# Patient Record
Sex: Female | Born: 1965 | Race: White | Hispanic: No | Marital: Married | Smoking: Never smoker
Health system: Southern US, Community
[De-identification: ages and names within clinical notes are randomized; demographics above are authoritative.]

## PROBLEM LIST (undated history)

## (undated) DIAGNOSIS — E039 Hypothyroidism, unspecified: Secondary | ICD-10-CM

## (undated) DIAGNOSIS — Z8489 Family history of other specified conditions: Secondary | ICD-10-CM

## (undated) DIAGNOSIS — M1990 Unspecified osteoarthritis, unspecified site: Secondary | ICD-10-CM

## (undated) HISTORY — DX: Hypothyroidism, unspecified: E03.9

## (undated) HISTORY — PX: NO PAST SURGERIES: SHX2092

---

## 1997-10-17 ENCOUNTER — Ambulatory Visit (HOSPITAL_COMMUNITY): Admission: RE | Admit: 1997-10-17 | Discharge: 1997-10-17 | Payer: Self-pay | Admitting: Obstetrics and Gynecology

## 1997-12-23 ENCOUNTER — Inpatient Hospital Stay (HOSPITAL_COMMUNITY): Admission: AD | Admit: 1997-12-23 | Discharge: 1997-12-26 | Payer: Self-pay | Admitting: Obstetrics and Gynecology

## 1998-02-04 ENCOUNTER — Other Ambulatory Visit: Admission: RE | Admit: 1998-02-04 | Discharge: 1998-02-04 | Payer: Self-pay | Admitting: Obstetrics and Gynecology

## 1999-12-28 ENCOUNTER — Other Ambulatory Visit: Admission: RE | Admit: 1999-12-28 | Discharge: 1999-12-28 | Payer: Self-pay | Admitting: Obstetrics and Gynecology

## 2001-06-14 ENCOUNTER — Other Ambulatory Visit: Admission: RE | Admit: 2001-06-14 | Discharge: 2001-06-14 | Payer: Self-pay | Admitting: Obstetrics and Gynecology

## 2002-06-20 ENCOUNTER — Other Ambulatory Visit: Admission: RE | Admit: 2002-06-20 | Discharge: 2002-06-20 | Payer: Self-pay | Admitting: Obstetrics and Gynecology

## 2003-06-24 ENCOUNTER — Other Ambulatory Visit: Admission: RE | Admit: 2003-06-24 | Discharge: 2003-06-24 | Payer: Self-pay | Admitting: Obstetrics and Gynecology

## 2004-06-25 ENCOUNTER — Other Ambulatory Visit: Admission: RE | Admit: 2004-06-25 | Discharge: 2004-06-25 | Payer: Self-pay | Admitting: Obstetrics and Gynecology

## 2004-07-15 ENCOUNTER — Encounter: Admission: RE | Admit: 2004-07-15 | Discharge: 2004-07-15 | Payer: Self-pay | Admitting: Obstetrics and Gynecology

## 2005-06-30 ENCOUNTER — Other Ambulatory Visit: Admission: RE | Admit: 2005-06-30 | Discharge: 2005-06-30 | Payer: Self-pay | Admitting: Obstetrics and Gynecology

## 2005-07-23 ENCOUNTER — Encounter: Admission: RE | Admit: 2005-07-23 | Discharge: 2005-07-23 | Payer: Self-pay | Admitting: Obstetrics and Gynecology

## 2006-07-07 ENCOUNTER — Other Ambulatory Visit: Admission: RE | Admit: 2006-07-07 | Discharge: 2006-07-07 | Payer: Self-pay | Admitting: Obstetrics and Gynecology

## 2006-10-27 ENCOUNTER — Encounter: Admission: RE | Admit: 2006-10-27 | Discharge: 2006-10-27 | Payer: Self-pay | Admitting: Obstetrics and Gynecology

## 2007-10-04 ENCOUNTER — Other Ambulatory Visit: Admission: RE | Admit: 2007-10-04 | Discharge: 2007-10-04 | Payer: Self-pay | Admitting: Obstetrics and Gynecology

## 2007-11-01 ENCOUNTER — Encounter: Admission: RE | Admit: 2007-11-01 | Discharge: 2007-11-01 | Payer: Self-pay | Admitting: Obstetrics and Gynecology

## 2007-11-08 ENCOUNTER — Encounter: Admission: RE | Admit: 2007-11-08 | Discharge: 2007-11-08 | Payer: Self-pay | Admitting: Obstetrics and Gynecology

## 2008-10-09 ENCOUNTER — Other Ambulatory Visit: Admission: RE | Admit: 2008-10-09 | Discharge: 2008-10-09 | Payer: Self-pay | Admitting: Obstetrics and Gynecology

## 2008-10-25 ENCOUNTER — Encounter: Payer: Self-pay | Admitting: Family Medicine

## 2008-11-28 ENCOUNTER — Encounter: Admission: RE | Admit: 2008-11-28 | Discharge: 2008-11-28 | Payer: Self-pay | Admitting: Obstetrics and Gynecology

## 2008-12-12 ENCOUNTER — Ambulatory Visit: Payer: Self-pay | Admitting: Family Medicine

## 2008-12-12 DIAGNOSIS — E039 Hypothyroidism, unspecified: Secondary | ICD-10-CM | POA: Insufficient documentation

## 2009-02-11 ENCOUNTER — Ambulatory Visit: Payer: Self-pay | Admitting: Family Medicine

## 2009-02-17 ENCOUNTER — Encounter (INDEPENDENT_AMBULATORY_CARE_PROVIDER_SITE_OTHER): Payer: Self-pay | Admitting: *Deleted

## 2009-02-17 LAB — CONVERTED CEMR LAB: Free T4: 0.9 ng/dL (ref 0.6–1.6)

## 2009-10-09 ENCOUNTER — Other Ambulatory Visit: Admission: RE | Admit: 2009-10-09 | Discharge: 2009-10-09 | Payer: Self-pay | Admitting: Obstetrics and Gynecology

## 2009-12-01 ENCOUNTER — Encounter: Admission: RE | Admit: 2009-12-01 | Discharge: 2009-12-01 | Payer: Self-pay | Admitting: Obstetrics and Gynecology

## 2010-04-27 ENCOUNTER — Telehealth (INDEPENDENT_AMBULATORY_CARE_PROVIDER_SITE_OTHER): Payer: Self-pay | Admitting: *Deleted

## 2010-05-01 ENCOUNTER — Ambulatory Visit
Admission: RE | Admit: 2010-05-01 | Discharge: 2010-05-01 | Payer: Self-pay | Source: Home / Self Care | Attending: Family Medicine | Admitting: Family Medicine

## 2010-05-01 ENCOUNTER — Encounter: Payer: Self-pay | Admitting: Family Medicine

## 2010-05-01 ENCOUNTER — Ambulatory Visit: Admit: 2010-05-01 | Payer: Self-pay | Admitting: Family Medicine

## 2010-05-04 ENCOUNTER — Encounter: Payer: Self-pay | Admitting: Obstetrics and Gynecology

## 2010-05-05 ENCOUNTER — Telehealth (INDEPENDENT_AMBULATORY_CARE_PROVIDER_SITE_OTHER): Payer: Self-pay | Admitting: *Deleted

## 2010-05-05 LAB — CONVERTED CEMR LAB: Free T4: 1.07 ng/dL (ref 0.80–1.80)

## 2010-05-14 NOTE — Progress Notes (Signed)
Summary: LAB ORDERS  Phone Note Call from Patient   Caller: Patient Summary of Call: PT HAD CPX BACK IN [place] WITH GYN .JUST NEED LABS WORK DONE FOR A TSH.  JUST NEED ORDERS FOR THIS. APPT HAS BEEN MADE FOR FRI. 20. Initial call taken by: Freddy Jaksch,  April 27, 2010 1:47 PM  Follow-up for Phone Call        please advise Follow-up by: Almeta Monas CMA Duncan Dull),  April 27, 2010 2:02 PM  Additional Follow-up for Phone Call Additional follow up Details #1::        she needs appointment to see me---not just for labs Additional Follow-up by: Loreen Freud DO,  April 27, 2010 3:03 PM    Additional Follow-up for Phone Call Additional follow up Details #2::    PT WAS CALL BACK AND WAS SCHED FOR AN OV WITH DOC. Follow-up by: Freddy Jaksch,  April 27, 2010 4:05 PM

## 2010-05-14 NOTE — Assessment & Plan Note (Signed)
Summary: CHECK UP AND LABS//PH   Vital Signs:  Patient profile:   45 year old female Height:      65 inches Weight:      177.6 pounds BMI:     29.66 Pulse rate:   76 / minute Pulse rhythm:   regular BP sitting:   124 / 72  (right arm) Cuff size:   regular  Vitals Entered By: Almeta Monas CMA Duncan Dull) (May 01, 2010 2:59 PM) CC: checkup...pt needs TSH done for thyroid meds   History of Present Illness: Pt here f/u thyroid only.   No complaints.  Problems Prior to Update: 1)  Hypothyroidism  (ICD-244.9)  Medications Prior to Update: 1)  Loestrin 24 Fe 1-20 Mg-Mcg Tabs (Norethin Ace-Eth Estrad-Fe) .... As Directed 2)  Synthroid 50 Mcg Tabs (Levothyroxine Sodium) .Marland Kitchen.. 1 By Mouth Once Daily** Office Visit and Labs Due Now**  Current Medications (verified): 1)  Loestrin 24 Fe 1-20 Mg-Mcg Tabs (Norethin Ace-Eth Estrad-Fe) .... As Directed 2)  Synthroid 50 Mcg Tabs (Levothyroxine Sodium) .Marland Kitchen.. 1 By Mouth Once Daily  Allergies (verified): No Known Drug Allergies  Past History:  Family History: Last updated: 12/12/2008 DM -PATERNAL GF,FATHER HTN -MOTHER BREAST CA-AUNT Father--throat cancer, heart transplant  Social History: Last updated: 12/12/2008 Married Never Smoked Alcohol use-yes Drug use-no Regular exercise-yes  Risk Factors: Alcohol Use: <1 (12/12/2008) Caffeine Use: 1 CUP A DAY (12/12/2008) Exercise: yes (12/12/2008)  Risk Factors: Smoking Status: never (12/12/2008)  Past medical, surgical, family and social histories (including risk factors) reviewed for relevance to current acute and chronic problems.  Past Medical History: Reviewed history from 12/12/2008 and no changes required. Hypothyroidism  Past Surgical History: Reviewed history from 12/12/2008 and no changes required. none  Family History: Reviewed history from 12/12/2008 and no changes required. DM -PATERNAL GF,FATHER HTN -MOTHER BREAST CA-AUNT Father--throat cancer, heart  transplant  Social History: Reviewed history from 12/12/2008 and no changes required. Married Never Smoked Alcohol use-yes Drug use-no Regular exercise-yes  Review of Systems      See HPI  The patient denies anorexia, fever, weight loss, weight gain, vision loss, decreased hearing, hoarseness, chest pain, syncope, dyspnea on exertion, peripheral edema, prolonged cough, headaches, hemoptysis, abdominal pain, melena, hematochezia, severe indigestion/heartburn, hematuria, incontinence, genital sores, muscle weakness, suspicious skin lesions, transient blindness, difficulty walking, depression, unusual weight change, abnormal bleeding, enlarged lymph nodes, angioedema, breast masses, and testicular masses.   GI:  Denies constipation and diarrhea. Derm:  Denies changes in color of skin, changes in nail beds, dryness, excessive perspiration, and hair loss.  Physical Exam  General:  Well-developed,well-nourished,in no acute distress; alert,appropriate and cooperative throughout examination Neck:  No deformities, masses, or tenderness noted. Lungs:  Normal respiratory effort, chest expands symmetrically. Lungs are clear to auscultation, no crackles or wheezes. Heart:  normal rate and no murmur.   Skin:  Intact without suspicious lesions or rashes Psych:  Oriented X3 and normally interactive.     Impression & Recommendations:  Problem # 1:  HYPOTHYROIDISM (ICD-244.9)  Her updated medication list for this problem includes:    Synthroid 50 Mcg Tabs (Levothyroxine sodium) .Marland Kitchen... 1 by mouth once daily  Orders: Venipuncture (16109) Specimen Handling (60454)  Labs Reviewed: TSH: 4.08 (02/11/2009)     Complete Medication List: 1)  Loestrin 24 Fe 1-20 Mg-mcg Tabs (Norethin ace-eth estrad-fe) .... As directed 2)  Synthroid 50 Mcg Tabs (Levothyroxine sodium) .Marland Kitchen.. 1 by mouth once daily  Other Orders: Tdap => 47yrs IM (09811) Admin 1st Vaccine (91478)  Patient Instructions: 1)  Please  schedule a follow-up appointment in 1 year.  Prescriptions: SYNTHROID 50 MCG TABS (LEVOTHYROXINE SODIUM) 1 by mouth once daily Brand medically necessary #90 x 3   Entered and Authorized by:   Loreen Freud DO   Signed by:   Loreen Freud DO on 05/01/2010   Method used:   Print then Give to Patient   RxID:   [id number] SYNTHROID 50 MCG TABS (LEVOTHYROXINE SODIUM) 1 by mouth once daily** OFFICE VISIT AND LABS DUE NOW** Brand medically necessary #90 x 3   Entered and Authorized by:   Loreen Freud DO   Signed by:   Loreen Freud DO on 05/01/2010   Method used:   Print then Give to Patient   RxID:   [id number]    Orders Added: 1)  Tdap => 64yrs IM [90715] 2)  Admin 1st Vaccine [90471] 3)  Venipuncture [32440] 4)  Est. Patient Level III [10272] 5)  Specimen Handling [99000]   Immunizations Administered:  Tetanus Vaccine:    Vaccine Type: Tdap    Site: left deltoid    Mfr: Merck    Dose: 0.5 ml    Route: IM    Given by: Almeta Monas CMA (AAMA)    Exp. Date: 01/30/2012    Lot #: [id number]    VIS given: 02/28/08 version given May 01, 2010.   Immunizations Administered:  Tetanus Vaccine:    Vaccine Type: Tdap    Site: left deltoid    Mfr: Merck    Dose: 0.5 ml    Route: IM    Given by: Almeta Monas CMA (AAMA)    Exp. Date: 01/30/2012    Lot #: [id number]    VIS given: 02/28/08 version given May 01, 2010.

## 2010-05-14 NOTE — Progress Notes (Signed)
Summary: Results  Phone Note Outgoing Call   Call placed by: Almeta Monas CMA Duncan Dull),  May 05, 2010 10:39 AM Call placed to: Patient Details for Reason: hypothyroid---increase synthroid to 75 micrograms #30  1 by mouth once daily ----brand only----2 refills recheck 2 months     244.9  TSH Summary of Call: Left message to call back.... Almeta Monas CMA Duncan Dull)  May 05, 2010 10:39 AM   Follow-up for Phone Call        Patient is aware and needs [facility]  Follow-up by: Harold Barban,  May 05, 2010 3:23 PM    New/Updated Medications: SYNTHROID 75 MCG TABS (LEVOTHYROXINE SODIUM) 1 by mouth once daily [BMN] Prescriptions: SYNTHROID 75 MCG TABS (LEVOTHYROXINE SODIUM) 1 by mouth once daily Brand medically necessary #30 x 2   Entered by:   Almeta Monas CMA (AAMA)   Authorized by:   Loreen Freud DO   Signed by:   Almeta Monas CMA (AAMA) on 05/05/2010   Method used:   [facility]ed to ...       [facility] [phone]* (retail)       [address]       [place], [place]  [address]       Ph: [phone]       Fax: [phone]   RxID:   [id number]

## 2010-06-26 ENCOUNTER — Other Ambulatory Visit: Payer: Self-pay

## 2010-06-29 ENCOUNTER — Other Ambulatory Visit (INDEPENDENT_AMBULATORY_CARE_PROVIDER_SITE_OTHER): Payer: BC Managed Care – PPO

## 2010-06-29 ENCOUNTER — Other Ambulatory Visit (INDEPENDENT_AMBULATORY_CARE_PROVIDER_SITE_OTHER): Payer: BC Managed Care – PPO | Admitting: Family Medicine

## 2010-06-29 ENCOUNTER — Encounter (INDEPENDENT_AMBULATORY_CARE_PROVIDER_SITE_OTHER): Payer: Self-pay | Admitting: *Deleted

## 2010-06-29 DIAGNOSIS — E039 Hypothyroidism, unspecified: Secondary | ICD-10-CM

## 2010-06-30 LAB — TSH: TSH: 3.52 u[IU]/mL (ref 0.35–5.50)

## 2010-07-13 ENCOUNTER — Telehealth: Payer: Self-pay

## 2010-07-13 MED ORDER — LEVOTHYROXINE SODIUM 50 MCG PO TABS: 50.0000 ug | ORAL_TABLET | Freq: Every day | ORAL | Status: DC

## 2010-07-13 NOTE — Telephone Encounter (Signed)
Pt called Rx never rcv'd at pharmacy..Refaxed    KP

## 2010-08-27 ENCOUNTER — Telehealth: Payer: Self-pay | Admitting: Family Medicine

## 2010-08-27 NOTE — Telephone Encounter (Signed)
Patient is scheduled for lab [phone]  - is tsh only lab she needs -  need dx

## 2010-08-27 NOTE — Telephone Encounter (Signed)
Yes this is the only order due to abnormal labs in March 2012---244.9   TSH

## 2010-08-27 NOTE — Telephone Encounter (Signed)
Dx added to appt °

## 2010-08-28 ENCOUNTER — Other Ambulatory Visit: Payer: Self-pay | Admitting: *Deleted

## 2010-08-28 DIAGNOSIS — E039 Hypothyroidism, unspecified: Secondary | ICD-10-CM

## 2010-08-31 ENCOUNTER — Other Ambulatory Visit (INDEPENDENT_AMBULATORY_CARE_PROVIDER_SITE_OTHER): Payer: BC Managed Care – PPO

## 2010-08-31 DIAGNOSIS — E039 Hypothyroidism, unspecified: Secondary | ICD-10-CM

## 2010-08-31 NOTE — Progress Notes (Signed)
Addended by: Floydene Flock on: 08/31/2010 03:15 PM   Modules accepted: Orders

## 2010-09-01 LAB — TSH: TSH: 4.12 u[IU]/mL (ref 0.35–5.50)

## 2010-10-06 ENCOUNTER — Other Ambulatory Visit (HOSPITAL_COMMUNITY)
Admission: RE | Admit: 2010-10-06 | Discharge: 2010-10-06 | Disposition: A | Discharge status: Home / Self Care | Payer: BC Managed Care – PPO | Source: Ambulatory Visit | Attending: Obstetrics and Gynecology | Admitting: Obstetrics and Gynecology

## 2010-10-06 DIAGNOSIS — Z01419 Encounter for gynecological examination (general) (routine) without abnormal findings: Secondary | ICD-10-CM | POA: Insufficient documentation

## 2010-10-12 ENCOUNTER — Other Ambulatory Visit: Payer: Self-pay | Admitting: Obstetrics and Gynecology

## 2010-10-12 DIAGNOSIS — Z1231 Encounter for screening mammogram for malignant neoplasm of breast: Secondary | ICD-10-CM

## 2010-11-06 ENCOUNTER — Other Ambulatory Visit: Payer: Self-pay | Admitting: Family Medicine

## 2010-11-06 MED ORDER — LEVOTHYROXINE SODIUM 50 MCG PO TABS: 50.0000 ug | ORAL_TABLET | Freq: Every day | ORAL | Status: DC

## 2010-11-06 NOTE — Telephone Encounter (Signed)
Refill sent.

## 2010-11-06 NOTE — Telephone Encounter (Signed)
Patient wants Synthroid -sent to [facility]  90 day supply

## 2010-12-03 ENCOUNTER — Ambulatory Visit: Payer: BC Managed Care – PPO

## 2010-12-09 ENCOUNTER — Ambulatory Visit
Admission: RE | Admit: 2010-12-09 | Discharge: 2010-12-09 | Disposition: A | Discharge status: Home / Self Care | Payer: BC Managed Care – PPO | Source: Ambulatory Visit | Attending: Obstetrics and Gynecology | Admitting: Obstetrics and Gynecology

## 2010-12-09 DIAGNOSIS — Z1231 Encounter for screening mammogram for malignant neoplasm of breast: Secondary | ICD-10-CM

## 2011-06-02 ENCOUNTER — Encounter: Payer: Self-pay | Admitting: Family Medicine

## 2011-06-02 ENCOUNTER — Ambulatory Visit (INDEPENDENT_AMBULATORY_CARE_PROVIDER_SITE_OTHER): Payer: BC Managed Care – PPO | Admitting: Family Medicine

## 2011-06-02 VITALS — BP 108/68 | HR 98 | Temp 98.7°F | Wt 176.6 lb

## 2011-06-02 DIAGNOSIS — J40 Bronchitis, not specified as acute or chronic: Secondary | ICD-10-CM

## 2011-06-02 MED ORDER — GUAIFENESIN-CODEINE 100-10 MG/5ML PO SYRP: 5.0000 mL | ORAL_SOLUTION | Freq: Three times a day (TID) | ORAL | Status: AC | PRN

## 2011-06-02 MED ORDER — AZITHROMYCIN 250 MG PO TABS: ORAL_TABLET | ORAL | Status: AC

## 2011-06-02 NOTE — Progress Notes (Signed)
  Subjective:     [name] is a 46 y.o. female who presents for evaluation of symptoms of a URI. Symptoms include achiness, congestion, cough described as nonproductive, low grade fever, nasal congestion, post nasal drip, purulent nasal discharge and sinus pressure. Onset of symptoms was 2 days ago, and has been gradually worsening since that time. Treatment to date: antihistamines and cough suppressants.  The following portions of the patient's history were reviewed and updated as appropriate: allergies, current medications, past family history, past medical history, past social history, past surgical history and problem list.  Review of Systems Pertinent items are noted in HPI.   Objective:    BP 108/68  Pulse 98  Temp(Src) 98.7 F (37.1 C) (Oral)  Wt 176 lb 9.6 oz (80.105 kg)  SpO2 98% General appearance: alert, cooperative, appears stated age and no distress Ears: normal TM's and external ear canals both ears Nose: clear discharge, mild congestion, turbinates normal, no sinus tenderness Throat: abnormal findings: pnd Neck: mild anterior cervical adenopathy, supple, symmetrical, trachea midline and thyroid not enlarged, symmetric, no tenderness/mass/nodules Lungs: diminished breath sounds bilaterally Heart: regular rate and rhythm, S1, S2 normal, no murmur, click, rub or gallop   Assessment:    bronchitis   Plan:    Suggested symptomatic OTC remedies. Nasal saline spray for congestion. Follow up as needed. rto prn  z pak cheratussin for night time, robitussin for day

## 2011-06-02 NOTE — Patient Instructions (Signed)

## 2011-10-05 ENCOUNTER — Other Ambulatory Visit: Payer: Self-pay | Admitting: Family Medicine

## 2011-10-05 MED ORDER — LEVOTHYROXINE SODIUM 50 MCG PO TABS: 50.0000 ug | ORAL_TABLET | Freq: Every day | ORAL | Status: DC

## 2011-10-05 NOTE — Telephone Encounter (Signed)
refill l-thyroxine synthroid tab 50 MCG #90, take one tablet daily, last fill 7.27.12,  last OV 2.20.13

## 2011-10-07 ENCOUNTER — Other Ambulatory Visit (HOSPITAL_COMMUNITY)
Admission: RE | Admit: 2011-10-07 | Discharge: 2011-10-07 | Disposition: A | Discharge status: Home / Self Care | Payer: BC Managed Care – PPO | Source: Ambulatory Visit | Attending: Obstetrics and Gynecology | Admitting: Obstetrics and Gynecology

## 2011-10-07 ENCOUNTER — Other Ambulatory Visit: Payer: Self-pay | Admitting: Obstetrics and Gynecology

## 2011-10-07 DIAGNOSIS — Z1159 Encounter for screening for other viral diseases: Secondary | ICD-10-CM | POA: Insufficient documentation

## 2011-10-07 DIAGNOSIS — Z01419 Encounter for gynecological examination (general) (routine) without abnormal findings: Secondary | ICD-10-CM | POA: Insufficient documentation

## 2011-11-29 ENCOUNTER — Other Ambulatory Visit: Payer: Self-pay | Admitting: Obstetrics and Gynecology

## 2011-11-29 DIAGNOSIS — Z1231 Encounter for screening mammogram for malignant neoplasm of breast: Secondary | ICD-10-CM

## 2011-12-15 ENCOUNTER — Other Ambulatory Visit: Payer: Self-pay | Admitting: Family Medicine

## 2011-12-16 ENCOUNTER — Ambulatory Visit
Admission: RE | Admit: 2011-12-16 | Discharge: 2011-12-16 | Disposition: A | Discharge status: Home / Self Care | Payer: BC Managed Care – PPO | Source: Ambulatory Visit | Attending: Obstetrics and Gynecology | Admitting: Obstetrics and Gynecology

## 2011-12-16 DIAGNOSIS — Z1231 Encounter for screening mammogram for malignant neoplasm of breast: Secondary | ICD-10-CM

## 2012-01-24 ENCOUNTER — Ambulatory Visit: Payer: BC Managed Care – PPO | Admitting: Rehabilitation

## 2012-03-14 ENCOUNTER — Other Ambulatory Visit: Payer: Self-pay | Admitting: Family Medicine

## 2012-03-14 NOTE — Telephone Encounter (Signed)
Rx sent.    MW 

## 2012-05-26 ENCOUNTER — Ambulatory Visit: Payer: Self-pay | Admitting: Family Medicine

## 2012-05-26 ENCOUNTER — Other Ambulatory Visit: Payer: Self-pay | Admitting: Family Medicine

## 2012-05-31 ENCOUNTER — Telehealth: Payer: Self-pay

## 2012-05-31 NOTE — Telephone Encounter (Signed)
Call a Nurse report regarding sore throat. She was requesting an Abx for strep throat with a temp.     I discussed with patient and she was seen in [facility] on Friday 05/26/12 when she did not hear back from [place]. She is feeling better and was prescribed antibiotics.     KP//CMA

## 2012-09-07 ENCOUNTER — Telehealth: Payer: Self-pay | Admitting: Family Medicine

## 2012-09-07 DIAGNOSIS — E039 Hypothyroidism, unspecified: Secondary | ICD-10-CM

## 2012-09-07 NOTE — Telephone Encounter (Signed)
Patient is due for labs and wants to come in tomorrow. Needs orders.

## 2012-09-07 NOTE — Telephone Encounter (Signed)
Msg left for the patient, she has not been seen in over a year and will need an OV with the provider.     KP

## 2012-09-08 ENCOUNTER — Encounter: Payer: Self-pay | Admitting: Family Medicine

## 2012-09-08 ENCOUNTER — Other Ambulatory Visit (INDEPENDENT_AMBULATORY_CARE_PROVIDER_SITE_OTHER): Payer: BC Managed Care – PPO

## 2012-09-08 DIAGNOSIS — E039 Hypothyroidism, unspecified: Secondary | ICD-10-CM

## 2012-09-08 LAB — TSH: TSH: 5.11 u[IU]/mL (ref 0.35–5.50)

## 2012-09-11 ENCOUNTER — Telehealth: Payer: Self-pay | Admitting: Family Medicine

## 2012-09-11 ENCOUNTER — Encounter: Payer: Self-pay | Admitting: *Deleted

## 2012-09-11 MED ORDER — LEVOTHYROXINE SODIUM 50 MCG PO TABS
ORAL_TABLET | ORAL | Status: DC
Start: 2012-09-11 — End: 2012-09-11

## 2012-09-11 MED ORDER — LEVOTHYROXINE SODIUM 50 MCG PO TABS: ORAL_TABLET | ORAL | Status: DC

## 2012-09-11 NOTE — Telephone Encounter (Signed)
Patient states that she only has 1 pill left of levothyroxine and would like to know if we either have samples or could send [facility]. She would also like a refill sent to [facility].

## 2012-10-17 ENCOUNTER — Encounter: Payer: BC Managed Care – PPO | Admitting: Family Medicine

## 2012-10-19 ENCOUNTER — Encounter: Payer: Self-pay | Admitting: Family Medicine

## 2012-10-19 ENCOUNTER — Ambulatory Visit (INDEPENDENT_AMBULATORY_CARE_PROVIDER_SITE_OTHER): Payer: BC Managed Care – PPO | Admitting: Family Medicine

## 2012-10-19 VITALS — BP 110/74 | HR 66 | Temp 98.7°F | Ht 64.75 in | Wt 182.6 lb

## 2012-10-19 DIAGNOSIS — R319 Hematuria, unspecified: Secondary | ICD-10-CM

## 2012-10-19 DIAGNOSIS — E039 Hypothyroidism, unspecified: Secondary | ICD-10-CM

## 2012-10-19 DIAGNOSIS — Z0000 Encounter for general adult medical examination without abnormal findings: Secondary | ICD-10-CM

## 2012-10-19 LAB — BASIC METABOLIC PANEL
BUN: 16 mg/dL (ref 6–23)
Calcium: 9.3 mg/dL (ref 8.4–10.5)
Chloride: 101 mEq/L (ref 96–112)
Sodium: 133 mEq/L — ABNORMAL LOW (ref 135–145)

## 2012-10-19 LAB — HEPATIC FUNCTION PANEL
AST: 19 U/L (ref 0–37)
Albumin: 4.2 g/dL (ref 3.5–5.2)
Alkaline Phosphatase: 58 U/L (ref 39–117)
Total Bilirubin: 0.8 mg/dL (ref 0.3–1.2)
Total Protein: 8.2 g/dL (ref 6.0–8.3)

## 2012-10-19 LAB — POCT URINALYSIS DIPSTICK
Bilirubin, UA: NEGATIVE
Nitrite, UA: NEGATIVE
Protein, UA: NEGATIVE
Urobilinogen, UA: 0.2
pH, UA: 5

## 2012-10-19 LAB — CBC WITH DIFFERENTIAL/PLATELET
Basophils Relative: 0.4 % (ref 0.0–3.0)
Eosinophils Absolute: 0.1 10*3/uL (ref 0.0–0.7)
Eosinophils Relative: 0.9 % (ref 0.0–5.0)
Lymphocytes Relative: 25.6 % (ref 12.0–46.0)
MCHC: 33.7 g/dL (ref 30.0–36.0)
MCV: 93 fl (ref 78.0–100.0)
Neutro Abs: 6 10*3/uL (ref 1.4–7.7)

## 2012-10-19 LAB — LIPID PANEL
Cholesterol: 246 mg/dL — ABNORMAL HIGH (ref 0–200)
HDL: 69 mg/dL (ref >39.00)
Total CHOL/HDL Ratio: 4
Triglycerides: 190 mg/dL — ABNORMAL HIGH (ref 0.0–149.0)
VLDL: 38 mg/dL (ref 0.0–40.0)

## 2012-10-19 MED ORDER — LEVOTHYROXINE SODIUM 50 MCG PO TABS: ORAL_TABLET | ORAL | Status: DC

## 2012-10-19 NOTE — Patient Instructions (Addendum)
Preventive Care for Adults, Female A healthy lifestyle and preventive care can promote health and wellness. Preventive health guidelines for women include the following key practices.  A routine yearly physical is a good way to check with your caregiver about your health and preventive screening. It is a chance to share any concerns and updates on your health, and to receive a thorough exam.  Visit your dentist for a routine exam and preventive care every 6 months. Brush your teeth twice a day and floss once a day. Good oral hygiene prevents tooth decay and gum disease.  The frequency of eye exams is based on your age, health, family medical history, use of contact lenses, and other factors. Follow your caregiver's recommendations for frequency of eye exams.  Eat a healthy diet. Foods like vegetables, fruits, whole grains, low-fat dairy products, and lean protein foods contain the nutrients you need without too many calories. Decrease your intake of foods high in solid fats, added sugars, and salt. Eat the right amount of calories for you.Get information about a proper diet from your caregiver, if necessary.  Regular physical exercise is one of the most important things you can do for your health. Most adults should get at least 150 minutes of moderate-intensity exercise (any activity that increases your heart rate and causes you to sweat) each week. In addition, most adults need muscle-strengthening exercises on 2 or more days a week.  Maintain a healthy weight. The body mass index (BMI) is a screening tool to identify possible weight problems. It provides an estimate of body fat based on height and weight. Your caregiver can help determine your BMI, and can help you achieve or maintain a healthy weight.For adults 20 years and older:  A BMI below 18.5 is considered underweight.  A BMI of 18.5 to 24.9 is normal.  A BMI of 25 to 29.9 is considered overweight.  A BMI of 30 and above is  considered obese.  Maintain normal blood lipids and cholesterol levels by exercising and minimizing your intake of saturated fat. Eat a balanced diet with plenty of fruit and vegetables. Blood tests for lipids and cholesterol should begin at age 20 and be repeated every 5 years. If your lipid or cholesterol levels are high, you are over 50, or you are at high risk for heart disease, you may need your cholesterol levels checked more frequently.Ongoing high lipid and cholesterol levels should be treated with medicines if diet and exercise are not effective.  If you smoke, find out from your caregiver how to quit. If you do not use tobacco, do not start.  If you are pregnant, do not drink alcohol. If you are breastfeeding, be very cautious about drinking alcohol. If you are not pregnant and choose to drink alcohol, do not exceed 1 drink per day. One drink is considered to be 12 ounces (355 mL) of beer, 5 ounces (148 mL) of wine, or 1.5 ounces (44 mL) of liquor.  Avoid use of street drugs. Do not share needles with anyone. Ask for help if you need support or instructions about stopping the use of drugs.  High blood pressure causes heart disease and increases the risk of stroke. Your blood pressure should be checked at least every 1 to 2 years. Ongoing high blood pressure should be treated with medicines if weight loss and exercise are not effective.  If you are 55 to 49 years old, ask your caregiver if you should take aspirin to prevent strokes.  Diabetes   screening involves taking a blood sample to check your fasting blood sugar level. This should be done once every 3 years, after age 45, if you are within normal weight and without risk factors for diabetes. Testing should be considered at a younger age or be carried out more frequently if you are overweight and have at least 1 risk factor for diabetes.  Breast cancer screening is essential preventive care for women. You should practice "breast  self-awareness." This means understanding the normal appearance and feel of your breasts and may include breast self-examination. Any changes detected, no matter how small, should be reported to a caregiver. Women in their 20s and 30s should have a clinical breast exam (CBE) by a caregiver as part of a regular health exam every 1 to 3 years. After age 40, women should have a CBE every year. Starting at age 40, women should consider having a mammography (breast X-ray test) every year. Women who have a family history of breast cancer should talk to their caregiver about genetic screening. Women at a high risk of breast cancer should talk to their caregivers about having magnetic resonance imaging (MRI) and a mammography every year.  The Pap test is a screening test for cervical cancer. A Pap test can show cell changes on the cervix that might become cervical cancer if left untreated. A Pap test is a procedure in which cells are obtained and examined from the lower end of the uterus (cervix).  Women should have a Pap test starting at age 21.  Between ages 21 and 29, Pap tests should be repeated every 2 years.  Beginning at age 30, you should have a Pap test every 3 years as long as the past 3 Pap tests have been normal.  Some women have medical problems that increase the chance of getting cervical cancer. Talk to your caregiver about these problems. It is especially important to talk to your caregiver if a new problem develops soon after your last Pap test. In these cases, your caregiver may recommend more frequent screening and Pap tests.  The above recommendations are the same for women who have or have not gotten the vaccine for human papillomavirus (HPV).  If you had a hysterectomy for a problem that was not cancer or a condition that could lead to cancer, then you no longer need Pap tests. Even if you no longer need a Pap test, a regular exam is a good idea to make sure no other problems are  starting.  If you are between ages 65 and 70, and you have had normal Pap tests going back 10 years, you no longer need Pap tests. Even if you no longer need a Pap test, a regular exam is a good idea to make sure no other problems are starting.  If you have had past treatment for cervical cancer or a condition that could lead to cancer, you need Pap tests and screening for cancer for at least 20 years after your treatment.  If Pap tests have been discontinued, risk factors (such as a new sexual partner) need to be reassessed to determine if screening should be resumed.  The HPV test is an additional test that may be used for cervical cancer screening. The HPV test looks for the virus that can cause the cell changes on the cervix. The cells collected during the Pap test can be tested for HPV. The HPV test could be used to screen women aged 30 years and older, and should   be used in women of any age who have unclear Pap test results. After the age of 30, women should have HPV testing at the same frequency as a Pap test.  Colorectal cancer can be detected and often prevented. Most routine colorectal cancer screening begins at the age of 50 and continues through age 75. However, your caregiver may recommend screening at an earlier age if you have risk factors for colon cancer. On a yearly basis, your caregiver may provide home test kits to check for hidden blood in the stool. Use of a small camera at the end of a tube, to directly examine the colon (sigmoidoscopy or colonoscopy), can detect the earliest forms of colorectal cancer. Talk to your caregiver about this at age 50, when routine screening begins. Direct examination of the colon should be repeated every 5 to 10 years through age 75, unless early forms of pre-cancerous polyps or small growths are found.  Hepatitis C blood testing is recommended for all people born from 1945 through 1965 and any individual with known risks for hepatitis C.  Practice  safe sex. Use condoms and avoid high-risk sexual practices to reduce the spread of sexually transmitted infections (STIs). STIs include gonorrhea, chlamydia, syphilis, trichomonas, herpes, HPV, and human immunodeficiency virus (HIV). Herpes, HIV, and HPV are viral illnesses that have no cure. They can result in disability, cancer, and death. Sexually active women aged 25 and younger should be checked for chlamydia. Older women with new or multiple partners should also be tested for chlamydia. Testing for other STIs is recommended if you are sexually active and at increased risk.  Osteoporosis is a disease in which the bones lose minerals and strength with aging. This can result in serious bone fractures. The risk of osteoporosis can be identified using a bone density scan. Women ages 65 and over and women at risk for fractures or osteoporosis should discuss screening with their caregivers. Ask your caregiver whether you should take a calcium supplement or vitamin D to reduce the rate of osteoporosis.  Menopause can be associated with physical symptoms and risks. Hormone replacement therapy is available to decrease symptoms and risks. You should talk to your caregiver about whether hormone replacement therapy is right for you.  Use sunscreen with sun protection factor (SPF) of 30 or more. Apply sunscreen liberally and repeatedly throughout the day. You should seek shade when your shadow is shorter than you. Protect yourself by wearing long sleeves, pants, a wide-brimmed hat, and sunglasses year round, whenever you are outdoors.  Once a month, do a whole body skin exam, using a mirror to look at the skin on your back. Notify your caregiver of new moles, moles that have irregular borders, moles that are larger than a pencil eraser, or moles that have changed in shape or color.  Stay current with required immunizations.  Influenza. You need a dose every fall (or winter). The composition of the flu vaccine  changes each year, so being vaccinated once is not enough.  Pneumococcal polysaccharide. You need 1 to 2 doses if you smoke cigarettes or if you have certain chronic medical conditions. You need 1 dose at age 65 (or older) if you have never been vaccinated.  Tetanus, diphtheria, pertussis (Tdap, Td). Get 1 dose of Tdap vaccine if you are younger than age 65, are over 65 and have contact with an infant, are a healthcare worker, are pregnant, or simply want to be protected from whooping cough. After that, you need a Td   booster dose every 10 years. Consult your caregiver if you have not had at least 3 tetanus and diphtheria-containing shots sometime in your life or have a deep or dirty wound.  HPV. You need this vaccine if you are a woman age 26 or younger. The vaccine is given in 3 doses over 6 months.  Measles, mumps, rubella (MMR). You need at least 1 dose of MMR if you were born in 1957 or later. You may also need a second dose.  Meningococcal. If you are age 19 to 21 and a first-year college student living in a residence hall, or have one of several medical conditions, you need to get vaccinated against meningococcal disease. You may also need additional booster doses.  Zoster (shingles). If you are age 60 or older, you should get this vaccine.  Varicella (chickenpox). If you have never had chickenpox or you were vaccinated but received only 1 dose, talk to your caregiver to find out if you need this vaccine.  Hepatitis A. You need this vaccine if you have a specific risk factor for hepatitis A virus infection or you simply wish to be protected from this disease. The vaccine is usually given as 2 doses, 6 to 18 months apart.  Hepatitis B. You need this vaccine if you have a specific risk factor for hepatitis B virus infection or you simply wish to be protected from this disease. The vaccine is given in 3 doses, usually over 6 months. Preventive Services / Frequency Ages 19 to 39  Blood  pressure check.** / Every 1 to 2 years.  Lipid and cholesterol check.** / Every 5 years beginning at age 20.  Clinical breast exam.** / Every 3 years for women in their 20s and 30s.  Pap test.** / Every 2 years from ages 21 through 29. Every 3 years starting at age 30 through age 65 or 70 with a history of 3 consecutive normal Pap tests.  HPV screening.** / Every 3 years from ages 30 through ages 65 to 70 with a history of 3 consecutive normal Pap tests.  Hepatitis C blood test.** / For any individual with known risks for hepatitis C.  Skin self-exam. / Monthly.  Influenza immunization.** / Every year.  Pneumococcal polysaccharide immunization.** / 1 to 2 doses if you smoke cigarettes or if you have certain chronic medical conditions.  Tetanus, diphtheria, pertussis (Tdap, Td) immunization. / A one-time dose of Tdap vaccine. After that, you need a Td booster dose every 10 years.  HPV immunization. / 3 doses over 6 months, if you are 26 and younger.  Measles, mumps, rubella (MMR) immunization. / You need at least 1 dose of MMR if you were born in 1957 or later. You may also need a second dose.  Meningococcal immunization. / 1 dose if you are age 19 to 21 and a first-year college student living in a residence hall, or have one of several medical conditions, you need to get vaccinated against meningococcal disease. You may also need additional booster doses.  Varicella immunization.** / Consult your caregiver.  Hepatitis A immunization.** / Consult your caregiver. 2 doses, 6 to 18 months apart.  Hepatitis B immunization.** / Consult your caregiver. 3 doses usually over 6 months. Ages 40 to 64  Blood pressure check.** / Every 1 to 2 years.  Lipid and cholesterol check.** / Every 5 years beginning at age 20.  Clinical breast exam.** / Every year after age 40.  Mammogram.** / Every year beginning at age 40   and continuing for as long as you are in good health. Consult with your  caregiver.  Pap test.** / Every 3 years starting at age 30 through age 65 or 70 with a history of 3 consecutive normal Pap tests.  HPV screening.** / Every 3 years from ages 30 through ages 65 to 70 with a history of 3 consecutive normal Pap tests.  Fecal occult blood test (FOBT) of stool. / Every year beginning at age 50 and continuing until age 75. You may not need to do this test if you get a colonoscopy every 10 years.  Flexible sigmoidoscopy or colonoscopy.** / Every 5 years for a flexible sigmoidoscopy or every 10 years for a colonoscopy beginning at age 50 and continuing until age 75.  Hepatitis C blood test.** / For all people born from 1945 through 1965 and any individual with known risks for hepatitis C.  Skin self-exam. / Monthly.  Influenza immunization.** / Every year.  Pneumococcal polysaccharide immunization.** / 1 to 2 doses if you smoke cigarettes or if you have certain chronic medical conditions.  Tetanus, diphtheria, pertussis (Tdap, Td) immunization.** / A one-time dose of Tdap vaccine. After that, you need a Td booster dose every 10 years.  Measles, mumps, rubella (MMR) immunization. / You need at least 1 dose of MMR if you were born in 1957 or later. You may also need a second dose.  Varicella immunization.** / Consult your caregiver.  Meningococcal immunization.** / Consult your caregiver.  Hepatitis A immunization.** / Consult your caregiver. 2 doses, 6 to 18 months apart.  Hepatitis B immunization.** / Consult your caregiver. 3 doses, usually over 6 months. Ages 65 and over  Blood pressure check.** / Every 1 to 2 years.  Lipid and cholesterol check.** / Every 5 years beginning at age 20.  Clinical breast exam.** / Every year after age 40.  Mammogram.** / Every year beginning at age 40 and continuing for as long as you are in good health. Consult with your caregiver.  Pap test.** / Every 3 years starting at age 30 through age 65 or 70 with a 3  consecutive normal Pap tests. Testing can be stopped between 65 and 70 with 3 consecutive normal Pap tests and no abnormal Pap or HPV tests in the past 10 years.  HPV screening.** / Every 3 years from ages 30 through ages 65 or 70 with a history of 3 consecutive normal Pap tests. Testing can be stopped between 65 and 70 with 3 consecutive normal Pap tests and no abnormal Pap or HPV tests in the past 10 years.  Fecal occult blood test (FOBT) of stool. / Every year beginning at age 50 and continuing until age 75. You may not need to do this test if you get a colonoscopy every 10 years.  Flexible sigmoidoscopy or colonoscopy.** / Every 5 years for a flexible sigmoidoscopy or every 10 years for a colonoscopy beginning at age 50 and continuing until age 75.  Hepatitis C blood test.** / For all people born from 1945 through 1965 and any individual with known risks for hepatitis C.  Osteoporosis screening.** / A one-time screening for women ages 65 and over and women at risk for fractures or osteoporosis.  Skin self-exam. / Monthly.  Influenza immunization.** / Every year.  Pneumococcal polysaccharide immunization.** / 1 dose at age 65 (or older) if you have never been vaccinated.  Tetanus, diphtheria, pertussis (Tdap, Td) immunization. / A one-time dose of Tdap vaccine if you are over   65 and have contact with an infant, are a healthcare worker, or simply want to be protected from whooping cough. After that, you need a Td booster dose every 10 years.  Varicella immunization.** / Consult your caregiver.  Meningococcal immunization.** / Consult your caregiver.  Hepatitis A immunization.** / Consult your caregiver. 2 doses, 6 to 18 months apart.  Hepatitis B immunization.** / Check with your caregiver. 3 doses, usually over 6 months. ** Family history and personal history of risk and conditions may change your caregiver's recommendations. Document Released: 05/25/2001 Document Revised: 06/21/2011  Document Reviewed: 08/24/2010 [employer] Patient Information 2014 [employer].  

## 2012-10-19 NOTE — Progress Notes (Signed)
  Subjective:     [name] is a 47 y.o. female and is here for a comprehensive physical exam. The patient reports no problems.  History   Social History  . Marital Status: Married    Spouse Name: N/A    Number of Children: N/A  . Years of Education: N/A   Occupational History  . Not on file.   Social History Main Topics  . Smoking status: Never Smoker   . Smokeless tobacco: Never Used  . Alcohol Use: Yes     Comment: Moderate  . Drug Use: No  . Sexually Active: Not on file   Other Topics Concern  . Not on file   Social History Narrative  . No narrative on file   Health Maintenance  Topic Date Due  . Influenza Vaccine  12/11/2012  . Pap Smear  10/07/2014  . Tetanus/tdap  05/01/2020    The following portions of the patient's history were reviewed and updated as appropriate: allergies, current medications, past family history, past medical history, past social history, past surgical history and problem list.  Review of Systems Review of Systems  Constitutional: Negative for activity change, appetite change and fatigue.  HENT: Negative for hearing loss, congestion, tinnitus and ear discharge.  dentist q67m Eyes: Negative for visual disturbance (see optho q2y) Respiratory: Negative for cough, chest tightness and shortness of breath.   Cardiovascular: Negative for chest pain, palpitations and leg swelling.  Gastrointestinal: Negative for abdominal pain, diarrhea, constipation and abdominal distention.  Genitourinary: Negative for urgency, frequency, decreased urine volume and difficulty urinating.  Musculoskeletal: Negative for back pain, arthralgias and gait problem.  Skin: Negative for color change, pallor and rash.  Neurological: Negative for dizziness, light-headedness, numbness and headaches.  Hematological: Negative for adenopathy. Does not bruise/bleed easily.  Psychiatric/Behavioral: Negative for suicidal ideas, confusion, sleep disturbance, self-injury,  dysphoric mood, decreased concentration and agitation.   Dr Stefanie Libel-- [facility]-- derm Gyn-- [facility]    Objective:    BP 110/74  Pulse 66  Temp(Src) 98.7 F (37.1 C) (Oral)  Ht 5' 4.75" (1.645 m)  Wt 182 lb 9.6 oz (82.827 kg)  BMI 30.61 kg/m2  SpO2 98% General appearance: alert, cooperative, appears stated age and no distress Head: Normocephalic, without obvious abnormality, atraumatic Eyes: conjunctivae/corneas clear. PERRL, EOM's intact. Fundi benign. Ears: normal TM's and external ear canals both ears Nose: Nares normal. Septum midline. Mucosa normal. No drainage or sinus tenderness. Throat: lips, mucosa, and tongue normal; teeth and gums normal Neck: no adenopathy, no carotid bruit, no JVD, supple, symmetrical, trachea midline and thyroid not enlarged, symmetric, no tenderness/mass/nodules Back: symmetric, no curvature. ROM normal. No CVA tenderness. Lungs: clear to auscultation bilaterally Breasts: gyn Heart: regular rate and rhythm, S1, S2 normal, no murmur, click, rub or gallop Abdomen: soft, non-tender; bowel sounds normal; no masses,  no organomegaly Pelvic: deferred --gyn Extremities: extremities normal, atraumatic, no cyanosis or edema Pulses: 2+ and symmetric Skin: Skin color, texture, turgor normal. No rashes or lesions Lymph nodes: Cervical, supraclavicular, and axillary nodes normal. Neurologic: Alert and oriented X 3, normal strength and tone. Normal symmetric reflexes. Normal coordination and gait Psych--no anxiety, no depression      Assessment:    Healthy female exam.      Plan:    ghm utd Check labs See After Visit Summary for Counseling Recommendations

## 2012-10-21 ENCOUNTER — Encounter: Payer: Self-pay | Admitting: Family Medicine

## 2012-10-21 LAB — URINE CULTURE

## 2012-10-21 NOTE — Assessment & Plan Note (Signed)
Check labs con't meds 

## 2012-10-23 ENCOUNTER — Other Ambulatory Visit: Payer: Self-pay

## 2012-10-23 ENCOUNTER — Encounter: Payer: Self-pay | Admitting: Family Medicine

## 2012-10-23 MED ORDER — PRAVASTATIN SODIUM 20 MG PO TABS: 20.0000 mg | ORAL_TABLET | Freq: Every day | ORAL | Status: DC

## 2012-10-23 NOTE — Telephone Encounter (Signed)
Pt states that she would like to work on diet and exercise for 3 months instead of starting Pravachol.Please advise

## 2012-12-07 ENCOUNTER — Other Ambulatory Visit: Payer: Self-pay

## 2012-12-07 DIAGNOSIS — Z1231 Encounter for screening mammogram for malignant neoplasm of breast: Secondary | ICD-10-CM

## 2012-12-27 ENCOUNTER — Ambulatory Visit: Payer: BC Managed Care – PPO

## 2013-01-10 ENCOUNTER — Ambulatory Visit
Admission: RE | Admit: 2013-01-10 | Discharge: 2013-01-10 | Disposition: A | Discharge status: Home / Self Care | Payer: BC Managed Care – PPO | Source: Ambulatory Visit

## 2013-01-10 DIAGNOSIS — Z1231 Encounter for screening mammogram for malignant neoplasm of breast: Secondary | ICD-10-CM

## 2013-02-15 ENCOUNTER — Other Ambulatory Visit: Payer: Self-pay

## 2013-06-14 ENCOUNTER — Other Ambulatory Visit (HOSPITAL_COMMUNITY): Payer: Self-pay | Admitting: Orthopaedic Surgery

## 2013-06-20 ENCOUNTER — Encounter (HOSPITAL_COMMUNITY): Payer: Self-pay | Admitting: Pharmacy Technician

## 2013-06-22 NOTE — Patient Instructions (Signed)
20 Teresita Maradiaga  06/22/2013   Your procedure is scheduled on: 06/29/13  [facility] 10:30 AM.  Call this number if you have problems the morning of surgery [phone]: [phone]   Remember:   Do not eat food or drink liquids After Midnight.     Take these medicines the morning of surgery with A SIP OF WATER: levothyroxine   Do not wear jewelry, make-up or nail polish.  Do not wear lotions, powders, or perfumes. You may wear deodorant.  Do not shave 48 hours prior to surgery. Men may shave face and neck.  Do not bring valuables to the hospital.  Contacts, dentures or bridgework may not be worn into surgery.  Leave suitcase in the car. After surgery it may be brought to your room.  For patients admitted to the hospital, checkout time is 11:00 AM the day of discharge.   Please read over the following fact sheets that you were given:[facility] preparing for surgery sheet, MRSA Information, incentive spirometry fact sheet, blood fact sheet Birdie Sonsachel Mical Brun, RN  pre op nurse call if needed [phone]    FAILURE TO FOLLOW THESE INSTRUCTIONS MAY RESULT IN CANCELLATION OF YOUR SURGERY   Patient Signature: ___________________________________________

## 2013-06-25 ENCOUNTER — Encounter (HOSPITAL_COMMUNITY): Payer: Self-pay

## 2013-06-25 ENCOUNTER — Encounter (HOSPITAL_COMMUNITY)
Admission: RE | Admit: 2013-06-25 | Discharge: 2013-06-25 | Disposition: A | Discharge status: Home / Self Care | Payer: BC Managed Care – PPO | Source: Ambulatory Visit | Attending: Orthopaedic Surgery | Admitting: Orthopaedic Surgery

## 2013-06-25 DIAGNOSIS — Z01812 Encounter for preprocedural laboratory examination: Secondary | ICD-10-CM | POA: Insufficient documentation

## 2013-06-25 HISTORY — DX: Family history of other specified conditions: Z84.89

## 2013-06-25 HISTORY — DX: Unspecified osteoarthritis, unspecified site: M19.90

## 2013-06-25 LAB — BASIC METABOLIC PANEL
BUN: 18 mg/dL (ref 6–23)
CO2: 25 mEq/L (ref 19–32)
CREATININE: 0.75 mg/dL (ref 0.50–1.10)
Calcium: 9.5 mg/dL (ref 8.4–10.5)
Chloride: 99 mEq/L (ref 96–112)
GFR calc non Af Amer: >90 mL/min (ref >90)
Glucose, Bld: 96 mg/dL (ref 70–99)
Potassium: 3.7 mEq/L (ref 3.7–5.3)
Sodium: 138 mEq/L (ref 137–147)

## 2013-06-25 LAB — URINE MICROSCOPIC-ADD ON

## 2013-06-25 LAB — ABO/RH: ABO/RH(D): B NEG

## 2013-06-25 LAB — URINALYSIS, ROUTINE W REFLEX MICROSCOPIC
Bilirubin Urine: NEGATIVE
Glucose, UA: NEGATIVE mg/dL
KETONES UR: NEGATIVE mg/dL
Nitrite: NEGATIVE
PROTEIN: NEGATIVE mg/dL
Specific Gravity, Urine: 1.014 (ref 1.005–1.030)
UROBILINOGEN UA: 0.2 mg/dL (ref 0.0–1.0)
pH: 5 (ref 5.0–8.0)

## 2013-06-25 LAB — APTT: aPTT: 29 seconds (ref 24–37)

## 2013-06-25 LAB — CBC
HEMATOCRIT: 41.5 % (ref 36.0–46.0)
Hemoglobin: 13.9 g/dL (ref 12.0–15.0)
MCH: 30.5 pg (ref 26.0–34.0)
MCHC: 33.5 g/dL (ref 30.0–36.0)
MCV: 91.2 fL (ref 78.0–100.0)
Platelets: 308 10*3/uL (ref 150–400)
RBC: 4.55 MIL/uL (ref 3.87–5.11)
RDW: 13 % (ref 11.5–15.5)
WBC: 8.8 10*3/uL (ref 4.0–10.5)

## 2013-06-25 LAB — SURGICAL PCR SCREEN
MRSA, PCR: NEGATIVE
Staphylococcus aureus: POSITIVE — AB

## 2013-06-25 LAB — PROTIME-INR
INR: 1 (ref 0.00–1.49)
Prothrombin Time: 13 seconds (ref 11.6–15.2)

## 2013-06-25 LAB — HCG, SERUM, QUALITATIVE: Preg, Serum: NEGATIVE

## 2013-06-25 NOTE — Progress Notes (Signed)
Prescription for Mupirocin [place] [facility] [place] Pt made aware.

## 2013-06-29 ENCOUNTER — Inpatient Hospital Stay (HOSPITAL_COMMUNITY): Payer: BC Managed Care – PPO

## 2013-06-29 ENCOUNTER — Inpatient Hospital Stay (HOSPITAL_COMMUNITY)
Admission: RE | Admit: 2013-06-29 | Discharge: 2013-07-01 | DRG: 470 | Disposition: A | Discharge status: Home Health | Payer: BC Managed Care – PPO | Source: Ambulatory Visit | Attending: Orthopaedic Surgery | Admitting: Orthopaedic Surgery

## 2013-06-29 ENCOUNTER — Encounter (HOSPITAL_COMMUNITY): Payer: BC Managed Care – PPO | Admitting: Certified Registered"

## 2013-06-29 ENCOUNTER — Encounter (HOSPITAL_COMMUNITY): Payer: Self-pay | Admitting: *Deleted

## 2013-06-29 ENCOUNTER — Inpatient Hospital Stay (HOSPITAL_COMMUNITY): Payer: BC Managed Care – PPO | Admitting: Certified Registered"

## 2013-06-29 ENCOUNTER — Encounter (HOSPITAL_COMMUNITY)
Admission: RE | Disposition: A | Discharge status: Home Health | Payer: Self-pay | Source: Ambulatory Visit | Attending: Orthopaedic Surgery

## 2013-06-29 DIAGNOSIS — D62 Acute posthemorrhagic anemia: Secondary | ICD-10-CM | POA: Diagnosis not present

## 2013-06-29 DIAGNOSIS — M169 Osteoarthritis of hip, unspecified: Principal | ICD-10-CM | POA: Diagnosis present

## 2013-06-29 DIAGNOSIS — Z01812 Encounter for preprocedural laboratory examination: Secondary | ICD-10-CM

## 2013-06-29 DIAGNOSIS — E039 Hypothyroidism, unspecified: Secondary | ICD-10-CM | POA: Diagnosis present

## 2013-06-29 DIAGNOSIS — M1611 Unilateral primary osteoarthritis, right hip: Secondary | ICD-10-CM

## 2013-06-29 DIAGNOSIS — Z96649 Presence of unspecified artificial hip joint: Secondary | ICD-10-CM

## 2013-06-29 DIAGNOSIS — Z833 Family history of diabetes mellitus: Secondary | ICD-10-CM

## 2013-06-29 DIAGNOSIS — M1610 Unilateral primary osteoarthritis, unspecified hip: Principal | ICD-10-CM | POA: Diagnosis present

## 2013-06-29 DIAGNOSIS — Z8249 Family history of ischemic heart disease and other diseases of the circulatory system: Secondary | ICD-10-CM

## 2013-06-29 HISTORY — PX: TOTAL HIP ARTHROPLASTY: SHX124

## 2013-06-29 LAB — TYPE AND SCREEN
ABO/RH(D): B NEG
Antibody Screen: NEGATIVE

## 2013-06-29 SURGERY — ARTHROPLASTY, HIP, TOTAL, ANTERIOR APPROACH
Anesthesia: Spinal | Site: Hip | Laterality: Right

## 2013-06-29 MED ORDER — MENTHOL 3 MG MT LOZG: 1.0000 | LOZENGE | OROMUCOSAL | Status: DC | PRN

## 2013-06-29 MED ORDER — DEXAMETHASONE SODIUM PHOSPHATE 10 MG/ML IJ SOLN
INTRAMUSCULAR | Status: AC
  Filled 2013-06-29: qty 1

## 2013-06-29 MED ORDER — ASPIRIN EC 325 MG PO TBEC
325.0000 mg | DELAYED_RELEASE_TABLET | Freq: Two times a day (BID) | ORAL | Status: DC
  Administered 2013-06-29 – 2013-07-01 (×4): 325 mg via ORAL
  Filled 2013-06-29 (×6): qty 1

## 2013-06-29 MED ORDER — DOCUSATE SODIUM 100 MG PO CAPS
100.0000 mg | ORAL_CAPSULE | Freq: Two times a day (BID) | ORAL | Status: DC
  Administered 2013-06-29 – 2013-07-01 (×4): 100 mg via ORAL
  Filled 2013-06-29 (×3): qty 1

## 2013-06-29 MED ORDER — MIDAZOLAM HCL 5 MG/5ML IJ SOLN
INTRAMUSCULAR | Status: DC | PRN
  Administered 2013-06-29: 2 mg via INTRAVENOUS

## 2013-06-29 MED ORDER — MEPERIDINE HCL 50 MG/ML IJ SOLN: 6.2500 mg | INTRAMUSCULAR | Status: DC | PRN

## 2013-06-29 MED ORDER — 0.9 % SODIUM CHLORIDE (POUR BTL) OPTIME
TOPICAL | Status: DC | PRN
  Administered 2013-06-29: 1000 mL

## 2013-06-29 MED ORDER — PHENOL 1.4 % MT LIQD: 1.0000 | OROMUCOSAL | Status: DC | PRN

## 2013-06-29 MED ORDER — ALUM & MAG HYDROXIDE-SIMETH 200-200-20 MG/5ML PO SUSP: 30.0000 mL | ORAL | Status: DC | PRN

## 2013-06-29 MED ORDER — SODIUM CHLORIDE 0.9 % IR SOLN
Status: DC | PRN
  Administered 2013-06-29: 1000 mL

## 2013-06-29 MED ORDER — OXYCODONE HCL 5 MG PO TABS
5.0000 mg | ORAL_TABLET | ORAL | Status: DC | PRN
  Administered 2013-06-29 – 2013-07-01 (×10): 5 mg via ORAL
  Filled 2013-06-29 (×3): qty 1
  Filled 2013-06-29 (×2): qty 2
  Filled 2013-06-29 (×4): qty 1
  Filled 2013-06-29: qty 2

## 2013-06-29 MED ORDER — BUPIVACAINE HCL (PF) 0.5 % IJ SOLN
INTRAMUSCULAR | Status: AC
  Filled 2013-06-29: qty 30

## 2013-06-29 MED ORDER — FENTANYL CITRATE 0.05 MG/ML IJ SOLN
INTRAMUSCULAR | Status: AC
  Filled 2013-06-29: qty 2

## 2013-06-29 MED ORDER — ONDANSETRON HCL 4 MG/2ML IJ SOLN: 4.0000 mg | Freq: Four times a day (QID) | INTRAMUSCULAR | Status: DC | PRN

## 2013-06-29 MED ORDER — ONDANSETRON HCL 4 MG/2ML IJ SOLN
INTRAMUSCULAR | Status: DC | PRN
  Administered 2013-06-29: 4 mg via INTRAVENOUS

## 2013-06-29 MED ORDER — METOCLOPRAMIDE HCL 10 MG PO TABS: 5.0000 mg | ORAL_TABLET | Freq: Three times a day (TID) | ORAL | Status: DC | PRN

## 2013-06-29 MED ORDER — CEFAZOLIN SODIUM 1-5 GM-% IV SOLN
1.0000 g | Freq: Four times a day (QID) | INTRAVENOUS | Status: AC
  Administered 2013-06-29 – 2013-06-30 (×2): 1 g via INTRAVENOUS
  Filled 2013-06-29 (×2): qty 50

## 2013-06-29 MED ORDER — MUPIROCIN CALCIUM 2 % NA OINT
1.0000 "application " | TOPICAL_OINTMENT | Freq: Two times a day (BID) | NASAL | Status: DC
  Filled 2013-06-29 (×17): qty 1

## 2013-06-29 MED ORDER — ACETAMINOPHEN 325 MG PO TABS
650.0000 mg | ORAL_TABLET | Freq: Four times a day (QID) | ORAL | Status: DC | PRN
Start: 2013-06-29 — End: 2013-07-01

## 2013-06-29 MED ORDER — PHENYLEPHRINE HCL 10 MG/ML IJ SOLN
INTRAMUSCULAR | Status: DC | PRN
  Administered 2013-06-29 (×4): 40 ug via INTRAVENOUS

## 2013-06-29 MED ORDER — HYDROMORPHONE HCL PF 1 MG/ML IJ SOLN
1.0000 mg | INTRAMUSCULAR | Status: DC | PRN
  Administered 2013-06-29: 0.5 mg via INTRAVENOUS
  Filled 2013-06-29: qty 1

## 2013-06-29 MED ORDER — FENTANYL CITRATE 0.05 MG/ML IJ SOLN
INTRAMUSCULAR | Status: DC | PRN
  Administered 2013-06-29: 100 ug via INTRAVENOUS

## 2013-06-29 MED ORDER — SODIUM CHLORIDE 0.9 % IV SOLN
INTRAVENOUS | Status: DC
  Administered 2013-06-29 – 2013-06-30 (×2): via INTRAVENOUS

## 2013-06-29 MED ORDER — POLYETHYLENE GLYCOL 3350 17 G PO PACK
17.0000 g | PACK | Freq: Every day | ORAL | Status: DC | PRN
  Filled 2013-06-29: qty 1

## 2013-06-29 MED ORDER — MIDAZOLAM HCL 2 MG/2ML IJ SOLN
INTRAMUSCULAR | Status: AC
  Filled 2013-06-29: qty 2

## 2013-06-29 MED ORDER — BUPIVACAINE HCL (PF) 0.5 % IJ SOLN
INTRAMUSCULAR | Status: DC | PRN
  Administered 2013-06-29: 3 mL

## 2013-06-29 MED ORDER — PROMETHAZINE HCL 25 MG/ML IJ SOLN: 6.2500 mg | INTRAMUSCULAR | Status: DC | PRN

## 2013-06-29 MED ORDER — LACTATED RINGERS IV SOLN
INTRAVENOUS | Status: DC
  Administered 2013-06-29 (×3): via INTRAVENOUS
  Administered 2013-06-29: 1000 mL via INTRAVENOUS

## 2013-06-29 MED ORDER — DEXAMETHASONE SODIUM PHOSPHATE 10 MG/ML IJ SOLN
INTRAMUSCULAR | Status: DC | PRN
  Administered 2013-06-29: 10 mg via INTRAVENOUS

## 2013-06-29 MED ORDER — LACTATED RINGERS IV SOLN: INTRAVENOUS | Status: DC

## 2013-06-29 MED ORDER — PHENYLEPHRINE 40 MCG/ML (10ML) SYRINGE FOR IV PUSH (FOR BLOOD PRESSURE SUPPORT)
PREFILLED_SYRINGE | INTRAVENOUS | Status: AC
  Filled 2013-06-29: qty 10

## 2013-06-29 MED ORDER — MUPIROCIN CALCIUM 2 % NA OINT
1.0000 "application " | TOPICAL_OINTMENT | Freq: Two times a day (BID) | NASAL | Status: DC
  Filled 2013-06-29: qty 1

## 2013-06-29 MED ORDER — METHOCARBAMOL 100 MG/ML IJ SOLN
500.0000 mg | Freq: Four times a day (QID) | INTRAVENOUS | Status: DC | PRN
  Filled 2013-06-29: qty 5

## 2013-06-29 MED ORDER — METOCLOPRAMIDE HCL 5 MG/ML IJ SOLN: 5.0000 mg | Freq: Three times a day (TID) | INTRAMUSCULAR | Status: DC | PRN

## 2013-06-29 MED ORDER — HYDROMORPHONE HCL PF 1 MG/ML IJ SOLN: 0.2500 mg | INTRAMUSCULAR | Status: DC | PRN

## 2013-06-29 MED ORDER — PROPOFOL 10 MG/ML IV BOLUS
INTRAVENOUS | Status: AC
  Filled 2013-06-29: qty 20

## 2013-06-29 MED ORDER — ONDANSETRON HCL 4 MG/2ML IJ SOLN
INTRAMUSCULAR | Status: AC
  Filled 2013-06-29: qty 2

## 2013-06-29 MED ORDER — LEVOTHYROXINE SODIUM 50 MCG PO TABS
50.0000 ug | ORAL_TABLET | Freq: Every day | ORAL | Status: DC
  Administered 2013-06-30 – 2013-07-01 (×2): 50 ug via ORAL
  Filled 2013-06-29 (×4): qty 1

## 2013-06-29 MED ORDER — CEFAZOLIN SODIUM-DEXTROSE 2-3 GM-% IV SOLR
INTRAVENOUS | Status: AC
  Filled 2013-06-29: qty 50

## 2013-06-29 MED ORDER — TRANEXAMIC ACID 100 MG/ML IV SOLN
1000.0000 mg | INTRAVENOUS | Status: AC
  Administered 2013-06-29: 1000 mg via INTRAVENOUS
  Filled 2013-06-29 (×2): qty 10

## 2013-06-29 MED ORDER — METHOCARBAMOL 500 MG PO TABS
500.0000 mg | ORAL_TABLET | Freq: Four times a day (QID) | ORAL | Status: DC | PRN
  Administered 2013-06-29 – 2013-07-01 (×5): 500 mg via ORAL
  Filled 2013-06-29 (×5): qty 1

## 2013-06-29 MED ORDER — MUPIROCIN 2 % EX OINT
TOPICAL_OINTMENT | Freq: Two times a day (BID) | CUTANEOUS | Status: DC
  Administered 2013-06-30: 21:00:00 via NASAL
  Administered 2013-06-30: 2 via NASAL
  Filled 2013-06-29: qty 22

## 2013-06-29 MED ORDER — PROPOFOL INFUSION 10 MG/ML OPTIME
INTRAVENOUS | Status: DC | PRN
Start: 2013-06-29 — End: 2013-06-29
  Administered 2013-06-29: 100 ug/kg/min via INTRAVENOUS

## 2013-06-29 MED ORDER — DIPHENHYDRAMINE HCL 12.5 MG/5ML PO ELIX: 12.5000 mg | ORAL_SOLUTION | ORAL | Status: DC | PRN

## 2013-06-29 MED ORDER — ONDANSETRON HCL 4 MG PO TABS
4.0000 mg | ORAL_TABLET | Freq: Four times a day (QID) | ORAL | Status: DC | PRN
  Administered 2013-07-01: 4 mg via ORAL
  Filled 2013-06-29: qty 1

## 2013-06-29 MED ORDER — NORETHIN ACE-ETH ESTRAD-FE 1-20 MG-MCG PO TABS
1.0000 | ORAL_TABLET | Freq: Every day | ORAL | Status: DC
  Administered 2013-06-29: 1 via ORAL

## 2013-06-29 MED ORDER — CEFAZOLIN SODIUM-DEXTROSE 2-3 GM-% IV SOLR
2.0000 g | INTRAVENOUS | Status: AC
  Administered 2013-06-29: 2 g via INTRAVENOUS

## 2013-06-29 SURGICAL SUPPLY — 40 items
APL SKNCLS STERI-STRIP NONHPOA (GAUZE/BANDAGES/DRESSINGS) ×1
BAG SPEC THK2 15X12 ZIP CLS (MISCELLANEOUS)
BAG ZIPLOCK 12X15 (MISCELLANEOUS) IMPLANT
BENZOIN TINCTURE PRP APPL 2/3 (GAUZE/BANDAGES/DRESSINGS) ×2 IMPLANT
BLADE SAW SGTL 18X1.27X75 (BLADE) ×2 IMPLANT
BLADE SAW SGTL 18X1.27X75MM (BLADE) ×1
CAPT HIP PF COP ×2 IMPLANT
CELLS DAT CNTRL 66122 CELL SVR (MISCELLANEOUS) ×1 IMPLANT
CLOSURE WOUND 1/2 X4 (GAUZE/BANDAGES/DRESSINGS) ×1
DRAPE C-ARM 42X120 X-RAY (DRAPES) ×3 IMPLANT
DRAPE STERI IOBAN 125X83 (DRAPES) ×3 IMPLANT
DRAPE U-SHAPE 47X51 STRL (DRAPES) ×9 IMPLANT
DRSG AQUACEL AG ADV 3.5X10 (GAUZE/BANDAGES/DRESSINGS) ×3 IMPLANT
DURAPREP 26ML APPLICATOR (WOUND CARE) ×3 IMPLANT
ELECT BLADE TIP CTD 4 INCH (ELECTRODE) ×3 IMPLANT
ELECT REM PT RETURN 9FT ADLT (ELECTROSURGICAL) ×3
ELECTRODE REM PT RTRN 9FT ADLT (ELECTROSURGICAL) ×1 IMPLANT
GLOVE BIO SURGEON STRL SZ7.5 (GLOVE) ×3 IMPLANT
GLOVE BIOGEL PI IND STRL 8 (GLOVE) ×2 IMPLANT
GLOVE BIOGEL PI INDICATOR 8 (GLOVE) ×4
GLOVE ECLIPSE 8.0 STRL XLNG CF (GLOVE) ×3 IMPLANT
GOWN BRE IMP PREV XXLGXLNG (GOWN DISPOSABLE) ×2 IMPLANT
GOWN STRL REUS W/TWL XL LVL3 (GOWN DISPOSABLE) ×8 IMPLANT
HANDPIECE INTERPULSE COAX TIP (DISPOSABLE) ×3
KIT BASIN OR (CUSTOM PROCEDURE TRAY) ×3 IMPLANT
PACK TOTAL JOINT (CUSTOM PROCEDURE TRAY) ×3 IMPLANT
PADDING CAST COTTON 6X4 STRL (CAST SUPPLIES) ×3 IMPLANT
RETRACTOR WND ALEXIS 18 MED (MISCELLANEOUS) ×1 IMPLANT
RTRCTR WOUND ALEXIS 18CM MED (MISCELLANEOUS) ×3
SET HNDPC FAN SPRY TIP SCT (DISPOSABLE) ×1 IMPLANT
STRIP CLOSURE SKIN 1/2X4 (GAUZE/BANDAGES/DRESSINGS) ×1 IMPLANT
SUT ETHIBOND NAB CT1 #1 30IN (SUTURE) ×3 IMPLANT
SUT MNCRL AB 4-0 PS2 18 (SUTURE) ×3 IMPLANT
SUT VIC AB 0 CT1 36 (SUTURE) ×3 IMPLANT
SUT VIC AB 1 CT1 36 (SUTURE) ×3 IMPLANT
SUT VIC AB 2-0 CT1 27 (SUTURE) ×3
SUT VIC AB 2-0 CT1 TAPERPNT 27 (SUTURE) ×1 IMPLANT
TOWEL OR 17X26 10 PK STRL BLUE (TOWEL DISPOSABLE) ×3 IMPLANT
TOWEL OR NON WOVEN STRL DISP B (DISPOSABLE) ×3 IMPLANT
TRAY FOLEY CATH 14FRSI W/METER (CATHETERS) ×2 IMPLANT

## 2013-06-29 NOTE — Plan of Care (Signed)
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     

## 2013-06-29 NOTE — Anesthesia Procedure Notes (Signed)
Spinal  Patient location during procedure: OR End time: 06/29/2013 12:45 PM Staffing CRNA/Resident: Noralyn Pick Performed by: anesthesiologist and resident/CRNA  Preanesthetic Checklist Completed: patient identified, site marked, surgical consent, pre-op evaluation, timeout performed, IV checked, risks and benefits discussed and monitors and equipment checked Spinal Block Patient position: sitting Prep: Betadine Patient monitoring: heart rate, continuous pulse ox and blood pressure Approach: midline Location: L2-3 Injection technique: single-shot Needle Needle type: Sprotte  Needle gauge: 24 G Needle length: 9 cm Assessment Sensory level: T6 Additional Notes Expiration date of kit checked and confirmed. Patient tolerated procedure well, without complications.

## 2013-06-29 NOTE — Op Note (Signed)
NAMStacy Gardner:  [name], [name]             ACCOUNT NO.:  [id number]  MEDICAL RECORD NO.:  [id number]  LOCATION:  1603                         [facility]  PHYSICIAN:  Vanita PandaChristopher Y. Magnus IvanBlackman, M.D.DATE OF BIRTH:  17-Oct-1965  DATE OF PROCEDURE:  06/29/2013 DATE OF DISCHARGE:                              OPERATIVE REPORT   PREOPERATIVE DIAGNOSIS:  Severe end-stage arthritis and degenerative joint disease, right hip.  POSTOPERATIVE DIAGNOSIS:  Severe end-stage arthritis and degenerative joint disease, right hip.  PROCEDURE:  Right total hip arthroplasty through direct anterior approach.  IMPLANTS:  DePuy Sector Gription acetabular component size 48, size 32+ 4 neutral polyethylene liner, size 8 Corail femoral component with standard offset, size 32+ 1 ceramic hip ball.  SURGEON:  Vanita PandaChristopher Y. Magnus IvanBlackman MD  ASSISTANT:  Richardean CanalGilbert Clark, PA  ANESTHESIA:  Spinal.  ANTIBIOTICS:  A 2 g IV Ancef.  BLOOD LOSS:  250 mL.  COMPLICATIONS:  None.  INDICATIONS:  [name] is a very pleasant 48 year old female, who was gracious and sent to me from Dr. Sandria Balesobert Weiner, another orthopedic surgeon due to the fact that she had severe debilitating hip disease with osteoarthritis of her right hip and the fact that she wanted to have a direct anterior hip surgery procedure for hip replacement.  She has failed all conservative treatment measures including anti- inflammatories, rest, walking with assisted devices, and even an intra- articular injection.  None of these things were helping.  She has had a significant change in her quality of life that has worsened.  Her pain is daily and her mobility is limited.  At this point, she wished to proceed with a direct anterior hip arthroplasty on the right hip.  She understands the risks of acute blood loss anemia, nerve and vessel injury, fracture, infection, dislocation, DVT.  She understands the goals are to decrease pain, improve mobility, and overall  improved quality of life.  PROCEDURE DESCRIPTION:  After informed consent was obtained, appropriate right hip was marked, she was brought to the operating room and spinal anesthesia was obtained while she was on her stretcher.  She was then laid in a supine position.  Traction boots were placed on both of her feet.  A Foley catheter was placed as well.  She was then placed supine on the hana fracture table with the perineal post in place and both legs in inline skeletal traction but no traction applied.  Her right operative hip was then prepped and draped with DuraPrep and sterile drapes.  A time-out was called.  She was identified as the correct patient, correct right hip.  Of note, she was significantly short preop on that right hip.  We then made an incision posterior and inferior to the anterior-superior iliac spine and carried this obliquely down the leg.  We dissected down the tensor fascia lata muscle.  The tensor fascia was then divided longitudinally.  I then proceeded with a direct anterior approach to the hip.  A Cobra retractor was placed around the lateral neck and then up underneath the rectus femoris.  The Cobra retractor was placed medially.  I cauterized the lateral femoral circumflex vessels and then opened up the hip capsule in a L-type  format finding a decent joint effusion.  I placed the Cobra retractor within the hip capsule and then made my femoral neck cut just proximal to the lesser trochanter using an oscillating saw and completed this with an osteotome.  I placed a corkscrew guide in the femoral head and removed the femoral head in its entirety and found it to be significantly devoid of cartilage.  We then placed a bent Hohmann around the medial acetabular rim and a Cobra retractor laterally.  I cleaned the acetabular rim and the debris in the labrum.  I then began reaming under direct visualization in 2 mm increments from a size 42 up to a size 48 with all  reamers under direct visualization, the last reamer also under direct fluoroscopy so that we can obtain our reaming in inclination and anteversion.  Once I was pleased with this, I placed the real DePuy Sector Gription acetabular component size 48, apex hole eliminator guide and a single screw.  I then placed the real 32+ 4 neutral polyethylene liner.  Attention was then turned to the femur with the leg externally rotated to 100 degrees extended and adducted, I was able to gain access to the femoral canal using a box cutting osteotome and a rongeur to lateralize, then placing a Mayo retractor medially and a Hohmann retractor behind the greater trochanter, I released the lateral joint capsule, then I only passed the size 8 broach, which was nice, tight and stable.  So I trialed a standard neck and a 32+ 1 hip ball and brought the leg back over and up with traction and internal rotation, it was stable past 90 degrees of external rotation and 45 degrees of internal rotation.  It had minimal shuck and leg lengths were measured and equal under direct fluoroscopy.  I then dislocated the hip and removed the trial components.  I then placed the real Corail femoral component size 8 with standard offset and the real 32+ 1 ceramic hip ball.  We reduced this back in the acetabulum and was stable.  We copiously irrigated the soft tissues with normal saline solution, closed the joint capsule with interrupted #1 Ethibond suture followed by running #1 Vicryl in the tensor fascia, 0 Vicryl in the deep tissue, 2-0 Vicryl in subcutaneous tissue, 4-0 Monocryl subcuticular stitch and Steri-Strips on the skin. An Aquacel dressing was applied.  She was taken off the hana table and taken to the recovery room in stable condition.  All final counts were correct and no complications noted.  Of note, Rexene Edison, PA-C was present in the entire case and his presence was crucial for patient positioning, retracting, and  layered closure of the wound.     Vanita Panda. Magnus Ivan, M.D.     CYB/MEDQ  D:  06/29/2013  T:  06/29/2013  Job:  [id number]

## 2013-06-29 NOTE — Brief Op Note (Signed)
06/29/2013  2:09 PM  PATIENT:  [name]  48 y.o. female  PRE-OPERATIVE DIAGNOSIS:  Severe osteoarthritis right hip  POST-OPERATIVE DIAGNOSIS:  Severe osteoarthritis right hip  PROCEDURE:  Procedure(s): RIGHT TOTAL HIP ARTHROPLASTY ANTERIOR APPROACH (Right)  SURGEON:  Surgeon(s) and Role:    * Kathryne Hitchhristopher Y Osa Fogarty, MD - Primary  PHYSICIAN ASSISTANT: Rexene EdisonGil Clark, PA-C  ANESTHESIA:   spinal  EBL:  Total I/O In: 2000 [I.V.:2000] Out: 450 [Urine:200; Blood:250]  BLOOD ADMINISTERED:none  DRAINS: none   LOCAL MEDICATIONS USED:  NONE  SPECIMEN:  No Specimen  DISPOSITION OF SPECIMEN:  N/A  COUNTS:  YES  TOURNIQUET:  * No tourniquets in log *  DICTATION: .Other Dictation: Dictation Number [id number]  PLAN OF CARE: Admit to inpatient   PATIENT DISPOSITION:  PACU - hemodynamically stable.   Delay start of Pharmacological VTE agent (>24hrs) due to surgical blood loss or risk of bleeding: no

## 2013-06-29 NOTE — Anesthesia Postprocedure Evaluation (Signed)
  Anesthesia Post-op Note  Patient: [name]  Procedure(s) Performed: Procedure(s) (LRB): RIGHT TOTAL HIP ARTHROPLASTY ANTERIOR APPROACH (Right)  Patient Location: PACU  Anesthesia Type: Spinal  Level of Consciousness: awake and alert   Airway and Oxygen Therapy: Patient Spontanous Breathing  Post-op Pain: mild  Post-op Assessment: Post-op Vital signs reviewed, Patient's Cardiovascular Status Stable, Respiratory Function Stable, Patent Airway and No signs of Nausea or vomiting  Last Vitals:  Filed Vitals:   06/29/13 1430  BP: 105/57  Pulse: 75  Temp: 36.4 C  Resp: 12    Post-op Vital Signs: stable   Complications: No apparent anesthesia complications

## 2013-06-29 NOTE — Anesthesia Preprocedure Evaluation (Addendum)
Anesthesia Evaluation  Patient identified by MRN, date of birth, ID band Patient awake    Reviewed: Allergy & Precautions, H&P , NPO status , Patient's Chart, lab work & pertinent test results  History of Anesthesia Complications Negative for: history of anesthetic complications  Airway Mallampati: II TM Distance: >3 FB Neck ROM: Full    Dental no notable dental hx.    Pulmonary neg pulmonary ROS,  breath sounds clear to auscultation  Pulmonary exam normal       Cardiovascular negative cardio ROS  Rhythm:Regular Rate:Normal     Neuro/Psych negative neurological ROS  negative psych ROS   GI/Hepatic negative GI ROS, Neg liver ROS,   Endo/Other  negative endocrine ROS  Renal/GU negative Renal ROS  negative genitourinary   Musculoskeletal negative musculoskeletal ROS (+)   Abdominal   Peds negative pediatric ROS (+)  Hematology negative hematology ROS (+)   Anesthesia Other Findings Upper front right cap  Reproductive/Obstetrics negative OB ROS                           Anesthesia Physical Anesthesia Plan  ASA: II  Anesthesia Plan: Spinal   Post-op Pain Management:    Induction:   Airway Management Planned: Simple Face Mask  Additional Equipment:   Intra-op Plan:   Post-operative Plan:   Informed Consent: I have reviewed the patients History and Physical, chart, labs and discussed the procedure including the risks, benefits and alternatives for the proposed anesthesia with the patient or authorized representative who has indicated his/her understanding and acceptance.   Dental advisory given  Plan Discussed with: CRNA  Anesthesia Plan Comments:         Anesthesia Quick Evaluation

## 2013-06-29 NOTE — H&P (Signed)
TOTAL HIP ADMISSION H&P  Patient is admitted for right total hip arthroplasty.  Subjective:  Chief Complaint: right hip pain  HPI: [name], 48 y.o. female, has a history of pain and functional disability in the right hip(s) due to arthritis and patient has failed non-surgical conservative treatments for greater than 12 weeks to include NSAID's and/or analgesics, corticosteriod injections, flexibility and strengthening excercises, supervised PT with diminished ADL's post treatment, weight reduction as appropriate and activity modification.  Onset of symptoms was gradual starting 5 years ago with gradually worsening course since that time.The patient noted no past surgery on the right hip(s).  Patient currently rates pain in the right hip at 10 out of 10 with activity. Patient has night pain, worsening of pain with activity and weight bearing, trendelenberg gait, pain that interfers with activities of daily living, pain with passive range of motion and crepitus. Patient has evidence of subchondral cysts, subchondral sclerosis, periarticular osteophytes and joint space narrowing by imaging studies. This condition presents safety issues increasing the risk of falls.  There is no current active infection.  Patient Active Problem List   Diagnosis Date Noted  . Arthritis of right hip 06/29/2013  . HYPOTHYROIDISM 12/12/2008   Past Medical History  Diagnosis Date  . Hypothyroid   . Arthritis   . Family history of anesthesia complication     mother severe N/V    Past Surgical History  Procedure Laterality Date  . No past surgeries      No prescriptions prior to admission   No Known Allergies  History  Substance Use Topics  . Smoking status: Never Smoker   . Smokeless tobacco: Never Used  . Alcohol Use: Yes     Comment: Moderate    Family History  Problem Relation Age of Onset  . Diabetes Paternal Grandfather   . Diabetes Father   . Hypertension Mother   . Breast cancer     Aunt  . Throat cancer Father   . Heart disease Father     Heart transplant     Review of Systems  Musculoskeletal: Positive for joint pain.  All other systems reviewed and are negative.    Objective:  Physical Exam  Constitutional: She is oriented to person, place, and time. She appears well-developed and well-nourished.  HENT:  Head: Normocephalic and atraumatic.  Eyes: EOM are normal. Pupils are equal, round, and reactive to light.  Neck: Normal range of motion. Neck supple.  Cardiovascular: Normal rate and regular rhythm.   Respiratory: Effort normal and breath sounds normal.  GI: Soft. Bowel sounds are normal.  Musculoskeletal:       Right hip: She exhibits decreased range of motion, decreased strength, bony tenderness and crepitus.  Neurological: She is alert and oriented to person, place, and time.  Skin: Skin is warm and dry.  Psychiatric: She has a normal mood and affect.    Vital signs in last 24 hours:    Labs:   Estimated body mass index is 30.61 kg/(m^2) as calculated from the following:   Height as of 10/19/12: 5' 4.75" (1.645 m).   Weight as of 10/19/12: 82.827 kg (182 lb 9.6 oz).   Imaging Review Plain radiographs demonstrate severe degenerative joint disease of the right hip(s). The bone quality appears to be good for age and reported activity level.  Assessment/Plan:  End stage arthritis, right hip(s)  The patient history, physical examination, clinical judgement of the provider and imaging studies are consistent with end stage degenerative joint  disease of the right hip(s) and total hip arthroplasty is deemed medically necessary. The treatment options including medical management, injection therapy, arthroscopy and arthroplasty were discussed at length. The risks and benefits of total hip arthroplasty were presented and reviewed. The risks due to aseptic loosening, infection, stiffness, dislocation/subluxation,  thromboembolic complications and other  imponderables were discussed.  The patient acknowledged the explanation, agreed to proceed with the plan and consent was signed. Patient is being admitted for inpatient treatment for surgery, pain control, PT, OT, prophylactic antibiotics, VTE prophylaxis, progressive ambulation and ADL's and discharge planning.The patient is planning to be discharged home with home health services

## 2013-06-29 NOTE — Transfer of Care (Signed)
Immediate Anesthesia Transfer of Care Note  Patient: [name]  Procedure(s) Performed: Procedure(s): RIGHT TOTAL HIP ARTHROPLASTY ANTERIOR APPROACH (Right)  Patient Location: PACU  Anesthesia Type:Regional  Level of Consciousness: awake, alert  and oriented  Airway & Oxygen Therapy: Patient Spontanous Breathing and Patient connected to face mask oxygen  Post-op Assessment: Report given to PACU RN and Post -op Vital signs reviewed and stable  Post vital signs: Reviewed and stable  Complications: No apparent anesthesia complications

## 2013-06-30 LAB — BASIC METABOLIC PANEL
BUN: 8 mg/dL (ref 6–23)
CHLORIDE: 103 meq/L (ref 96–112)
CO2: 25 mEq/L (ref 19–32)
CREATININE: 0.65 mg/dL (ref 0.50–1.10)
Calcium: 8.7 mg/dL (ref 8.4–10.5)
GFR calc Af Amer: >90 mL/min (ref >90)
GFR calc non Af Amer: >90 mL/min (ref >90)
Glucose, Bld: 139 mg/dL — ABNORMAL HIGH (ref 70–99)
Potassium: 3.9 mEq/L (ref 3.7–5.3)
Sodium: 139 mEq/L (ref 137–147)

## 2013-06-30 LAB — CBC
HEMATOCRIT: 34.6 % — AB (ref 36.0–46.0)
Hemoglobin: 11.3 g/dL — ABNORMAL LOW (ref 12.0–15.0)
MCH: 30 pg (ref 26.0–34.0)
MCHC: 32.7 g/dL (ref 30.0–36.0)
MCV: 91.8 fL (ref 78.0–100.0)
Platelets: 267 10*3/uL (ref 150–400)
RBC: 3.77 MIL/uL — ABNORMAL LOW (ref 3.87–5.11)
RDW: 12.9 % (ref 11.5–15.5)
WBC: 7.4 10*3/uL (ref 4.0–10.5)

## 2013-06-30 MED ORDER — OXYCODONE-ACETAMINOPHEN 5-325 MG PO TABS: 1.0000 | ORAL_TABLET | ORAL | Status: DC | PRN

## 2013-06-30 MED ORDER — METHOCARBAMOL 500 MG PO TABS: 500.0000 mg | ORAL_TABLET | Freq: Four times a day (QID) | ORAL | Status: DC | PRN

## 2013-06-30 MED ORDER — ASPIRIN 325 MG PO TBEC: 325.0000 mg | DELAYED_RELEASE_TABLET | Freq: Two times a day (BID) | ORAL | Status: DC

## 2013-06-30 NOTE — Progress Notes (Signed)
   Subjective:  Patient reports pain as mild.    Objective:   VITALS:   Filed Vitals:   06/29/13 2037 06/30/13 0136 06/30/13 0500 06/30/13 0800  BP: 121/76 106/72 114/74   Pulse: 84 76 64   Temp: 98.4 F (36.9 C) 98.5 F (36.9 C) 98.1 F (36.7 C)   TempSrc: Oral Oral Oral   Resp: 16 16 16 18   Height:      Weight:      SpO2: 98% 98% 100% 100%    Neurologically intact Neurovascular intact Sensation intact distally Intact pulses distally Dorsiflexion/Plantar flexion intact Incision: dressing C/D/I and no drainage No cellulitis present Compartment soft + femoral nerve   Lab Results  Component Value Date   WBC 7.4 06/30/2013   HGB 11.3* 06/30/2013   HCT 34.6* 06/30/2013   MCV 91.8 06/30/2013   PLT 267 06/30/2013     Assessment/Plan: 1 Day Post-Op   Problem List Items Addressed This Visit   None      Expected postop acute blood loss anemia - will monitor for symptoms Up with PT/OT DVT ppx - SCDs, ambulation, asa  WBAT right and lower extremity Pain control Discharge planning   Cheral AlmasXu, Zoltan Genest Michael 06/30/2013, 10:03 AM [phone]

## 2013-06-30 NOTE — Progress Notes (Signed)
I have reviewed this note and agree with all findings. Kati Zaylynn Rickett, PT, DPT Pager: [phone]   

## 2013-06-30 NOTE — Evaluation (Addendum)
Physical Therapy Evaluation Patient Details Name: Sparkles Mcneely MRN: [id number] DOB: 1966/01/16 Today's Date: 06/30/2013 Time: 7829-5621 PT Time Calculation (min): 23 min  PT Assessment / Plan / Recommendation History of Present Illness  Pt is post-op day 1 for a right total hip arthroplasty using an anterior approach for the treatment of severe end-stage degenerative arthritis  Clinical Impression  Patient is s/p R THA surgery resulting in functional limitations due to the deficits listed below (see PT Problem List). Pt reported weakness in R hip resulting from disuse prior to surgery.  Pt ambulated in hallway and performed exercises in sitting.  Pt mobilizing well POD 1.  Patient will benefit from skilled PT to increase their independence and safety with mobility to allow discharge.    PT Assessment  Patient needs continued PT services    Follow Up Recommendations  Home health PT;Supervision - Intermittent    Does the patient have the potential to tolerate intense rehabilitation      Barriers to Discharge        Equipment Recommendations  None recommended by PT    Recommendations for Other Services     Frequency 7X/week    Precautions / Restrictions Precautions Precautions: None Restrictions Weight Bearing Restrictions: Yes RLE Weight Bearing: Weight bearing as tolerated   Pertinent Vitals/Pain Pt premedicated with pain localized to incision site.  Activity to tolerance.  Ice provided at end of treatment.      Mobility  Bed Mobility Overal bed mobility: Needs Assistance Bed Mobility: Supine to Sit Supine to sit: Min assist General bed mobility comments: verbal cues for sequnce and safety; pt reported RLE feeling 'like dead weight' and required assistance to move through transfer Transfers Overall transfer level: Needs assistance Equipment used: Rolling walker (2 wheeled) Transfers: Sit to/from Stand Sit to Stand: Min guard General transfer comment: verbal cues  for safety and sequence Ambulation/Gait Ambulation/Gait assistance: Min guard Ambulation Distance (Feet): 160 Feet Assistive device: Rolling walker (2 wheeled) Gait Pattern/deviations: Step-through pattern;Antalgic;Decreased step length - right Gait velocity: decr General Gait Details: verbal cues for safety, sequence, and use of DME, pt able to initiate more RLE hip flexion with continued ambulation, gradually increasing R step length    Exercises Total Joint Exercises Ankle Circles/Pumps: AROM;20 reps;Both Towel Squeeze: AROM;Both;15 reps Short Arc Quad: AROM;15 reps;Right Heel Slides: AROM;Right;15 reps Hip ABduction/ADduction: AROM;Right;15 reps Straight Leg Raises: AAROM;10 reps;Right Marching in Standing: AROM;Both;15 reps;Seated   PT Diagnosis: Abnormality of gait;Acute pain  PT Problem List: Decreased strength;Decreased mobility;Decreased knowledge of use of DME;Pain PT Treatment Interventions: DME instruction;Gait training;Stair training;Functional mobility training;Therapeutic activities;Therapeutic exercise;Patient/family education     PT Goals(Current goals can be found in the care plan section) Acute Rehab PT Goals Patient Stated Goal: Return home and increase function  PT Goal Formulation: With patient Time For Goal Achievement: 07/05/13 Potential to Achieve Goals: Good  Visit Information  Last PT Received On: 06/30/13 Assistance Needed: +1 History of Present Illness: Pt is post-op day 1 for a right total hip arthroplasty using an anterior approach for the treatment of severe end-stage degenerative arthritis       Prior Functioning  Home Living Family/patient expects to be discharged to:: Private residence Living Arrangements: Spouse/significant other Available Help at Discharge: Family (Pt reported mother lives close by and husband at home, pt will not be alone for more than an hour at a time throughout the day) Type of Home: House Home Access: Stairs to  enter [employer] of Steps: 1 Home Layout:  One level Home Equipment: Walker - 2 wheels Prior Function Level of Independence: Independent Comments:  (Pt and family report RLE was too weak and painful to move actively prior to surgery ) Communication Communication: No difficulties Dominant Hand: Right    Cognition  Cognition Arousal/Alertness: Awake/alert Behavior During Therapy: WFL for tasks assessed/performed Overall Cognitive Status: Within Functional Limits for tasks assessed    Extremity/Trunk Assessment Upper Extremity Assessment Upper Extremity Assessment: Overall WFL for tasks assessed Lower Extremity Assessment Lower Extremity Assessment: RLE deficits/detail (difficulty initiating hip flexion at EOB; retested after ambulation, unable to flex hip through full ROM against gravity) Cervical / Trunk Assessment Cervical / Trunk Assessment: Normal   Balance    End of Session PT - End of Session Equipment Utilized During Treatment: Gait belt Activity Tolerance: Patient tolerated treatment well Patient left: in chair;with call bell/phone within reach;with family/visitor present  GP     Bufford LopeBiesack, Deven Furia 06/30/2013, 12:22 PM Bufford LopeLaura Raeanne Deschler SPT 06/29/2013

## 2013-06-30 NOTE — Evaluation (Signed)
I have reviewed this note and agree with all findings. Kati Nihaal Friesen, PT, DPT Pager: [phone]   

## 2013-06-30 NOTE — Progress Notes (Signed)
Occupational Therapy Evaluation Patient Details Name: [name] MRN: [id number] DOB: 02/29/1966 Today's Date: 06/30/2013 Time: [id number] OT Time Calculation (min): 18 min  OT Assessment / Plan / Recommendation History of present illness Pt is post-op day 1 for a right total hip arthroplasty using an anterior approach for the treatment of evere end-stage degenerative arthritis   Clinical Impression   Patient presents to OT with decreased ADL independence, will benefit from OT to maximize independence prior to d/c home.    OT Assessment  Patient needs continued OT Services    Follow Up Recommendations   Home with spouse, PRN Assistance/Supervision   Barriers to Discharge      Equipment Recommendations  None recommended by OT    Recommendations for Other Services    Frequency  Min 2X/week    Precautions / Restrictions Precautions Precautions: None Restrictions Weight Bearing Restrictions: Yes RLE Weight Bearing: Weight bearing as tolerated   Pertinent Vitals/Pain No c/o pain, just "tightness" at hip    ADL  Eating/Feeding: Performed;Independent Where Assessed - Eating/Feeding: Chair Grooming: Performed;Wash/dry hands;Supervision/safety Where Assessed - Grooming: Unsupported standing Upper Body Bathing: Simulated;Independent Where Assessed - Upper Body Bathing: Unsupported sitting Lower Body Bathing: Simulated;Minimal assistance Where Assessed - Lower Body Bathing: Unsupported sitting Upper Body Dressing: Simulated;Independent Where Assessed - Upper Body Dressing: Unsupported sitting Lower Body Dressing: Simulated;Minimal assistance Where Assessed - Lower Body Dressing: Unsupported sit to stand Toilet Transfer: Performed;Supervision/safety Toilet Transfer Method: Sit to stand;Stand pivot AcupuncturistToilet Transfer Equipment: Comfort height toilet Toileting - Clothing Manipulation and Hygiene: Performed;Supervision/safety Where Assessed - Engineer, miningToileting Clothing Manipulation and  Hygiene: Sit to stand from 3-in-1 or toilet Transfers/Ambulation Related to ADLs: Patient sit<>stand from recliner S. Amb to/from BR with RW and S.  ADL Comments: Patient educated on LB bathing and dressing techniques including no precautions, use pain as guide, dress operated leg first. Patient has slip on shoes with rubber sole.    OT Diagnosis: Acute pain  OT Problem List: Pain;Decreased knowledge of use of DME or AE OT Treatment Interventions: Self-care/ADL training;DME and/or AE instruction;Patient/family education;Therapeutic activities   OT Goals(Current goals can be found in the care plan section) Acute Rehab OT Goals Patient Stated Goal: to go home OT Goal Formulation: With patient Time For Goal Achievement: 07/07/13 Potential to Achieve Goals: Good  Visit Information  Last OT Received On: 06/30/13 Assistance Needed: +1 History of Present Illness: Pt is post-op day 1 for a right total hip arthroplasty using an anterior approach for the treatment of evere end-stage degenerative arthritis       Prior Functioning     Home Living Family/patient expects to be discharged to:: Private residence Living Arrangements: Spouse/significant other Available Help at Discharge: Family Type of Home: House Home Access: Stairs to enter Secretary/administratorntrance Stairs-Number of Steps: 1 Home Layout: One level Home Equipment: Other (comment) (can borrow toilet rails) Prior Function Level of Independence: Independent Communication Communication: No difficulties Dominant Hand: Right         Vision/Perception     Cognition  Cognition Arousal/Alertness: Awake/alert Behavior During Therapy: WFL for tasks assessed/performed Overall Cognitive Status: Within Functional Limits for tasks assessed    Extremity/Trunk Assessment Upper Extremity Assessment Upper Extremity Assessment: Overall WFL for tasks assessed Lower Extremity Assessment Lower Extremity Assessment: Defer to PT evaluation     End  of Session OT - End of Session Equipment Utilized During Treatment: Rolling walker Activity Tolerance: Patient tolerated treatment well Patient left: in chair;with call bell/phone within reach;with family/visitor present Nurse Communication:  Other (comment) (patient urinated in toilet)  GO     [name] A 06/30/2013, 10:05 AM

## 2013-06-30 NOTE — Progress Notes (Signed)
Physical Therapy Treatment   06/30/13 1546  PT Visit Information  Last PT Received On 06/30/13  Assistance Needed +1  History of Present Illness Pt is post-op day 1 for a right total hip arthroplasty using an anterior approach for the treatment of severe end-stage degenerative arthritis  PT Time Calculation  PT Start Time 0120  PT Stop Time 0141  PT Time Calculation (min) 21 min  Subjective Data  Patient Stated Goal Return home and increase function   Precautions  Precautions None  Restrictions  Weight Bearing Restrictions Yes  RLE Weight Bearing WBAT  Cognition  Arousal/Alertness Awake/alert  Behavior During Therapy WFL for tasks assessed/performed  Overall Cognitive Status Within Functional Limits for tasks assessed  Bed Mobility  Overal bed mobility Needs Assistance  Bed Mobility Supine to Sit;Sit to Supine  Supine to sit Supervision  Sit to supine Supervision  General bed mobility comments verbal cues for sequence and safety  Transfers  Overall transfer level Needs assistance  Equipment used Rolling walker (2 wheeled)  Transfers Sit to/from Stand  Sit to Stand Min guard  General transfer comment verbal cues for safety and sequence  Ambulation/Gait  Ambulation/Gait assistance Min guard  Ambulation Distance (Feet) 240 Feet  Assistive device Rolling walker (2 wheeled)  Gait Pattern/deviations Step-through pattern;Antalgic;Decreased step length - left  Gait velocity decr  General Gait Details verbal cues for safety, sequnce, and use of DME  Total Joint Exercises  Ankle Circles/Pumps AROM;20 reps;Both  Towel Squeeze AROM;Both;15 reps  Short Arc Quad AROM;15 reps;Right  Heel Slides AROM;Right;15 reps  Hip ABduction/ADduction AROM;Right;15 reps  Straight Leg Raises AAROM;10 reps;Right  Gluteal Sets AROM;15 reps;Both  Quad Sets AROM;15 reps;Both  PT - End of Session  Equipment Utilized During Treatment Gait belt  Activity Tolerance Patient tolerated treatment well   Patient left in bed;with call bell/phone within reach;with family/visitor present  PT - Assessment/Plan  PT Plan Current plan remains appropriate  PT Frequency 7X/week  Follow Up Recommendations Home health PT;Supervision - Intermittent  PT equipment None recommended by PT  PT Goal Progression  Progress towards PT goals Progressing toward goals  Acute Rehab PT Goals  PT Goal Formulation With patient  Time For Goal Achievement 07/05/13  Potential to Achieve Goals Good   Bufford LopeLaura Destaney Sarkis SPT 06/30/2013

## 2013-06-30 NOTE — Progress Notes (Signed)
   CARE MANAGEMENT NOTE 06/30/2013  Patient:  Renette ButtersMARRARA,Syeda   Account Number:  [id number]  Date Initiated:  06/30/2013  Documentation initiated by:  [facility],Aitanna Haubner  Subjective/Objective Assessment:   RIGHT TOTAL HIP ARTHROPLASTY ANTERIOR APPROACH     Action/Plan:   HH   Anticipated DC Date:  07/01/2013   Anticipated DC Plan:  HOME W HOME HEALTH SERVICES      DC Planning Services  CM consult      [facility]   Choice offered to / List presented to:  C-1 Patient        HH arranged  HH-2 PT      [facility]   Status of service:  Completed, signed off Medicare Important Message given?   (If response is "NO", the following Medicare IM given date fields will be blank) Date Medicare IM given:   Date Additional Medicare IM given:    Discharge Disposition:  HOME W HOME HEALTH SERVICES  Per UR Regulation:    If discussed at Long Length of Stay Meetings, dates discussed:    Comments:  06/30/2013 1330 NCM spoke to pt and [facility] States she has RW and cane at home from previous surgery. Isidoro DonningAlesia Lakesha Levinson RN CCM Case Mgmt phone [phone]

## 2013-07-01 LAB — CBC
HEMATOCRIT: 32.7 % — AB (ref 36.0–46.0)
HEMOGLOBIN: 10.9 g/dL — AB (ref 12.0–15.0)
MCH: 30.5 pg (ref 26.0–34.0)
MCHC: 33.3 g/dL (ref 30.0–36.0)
MCV: 91.6 fL (ref 78.0–100.0)
Platelets: 241 10*3/uL (ref 150–400)
RBC: 3.57 MIL/uL — ABNORMAL LOW (ref 3.87–5.11)
RDW: 13.3 % (ref 11.5–15.5)
WBC: 11.3 10*3/uL — ABNORMAL HIGH (ref 4.0–10.5)

## 2013-07-01 NOTE — Progress Notes (Signed)
Discharged from floor via w/c, spouse with pt. No changes in assessment. Tait Balistreri   

## 2013-07-01 NOTE — Discharge Summary (Signed)
Patient ID: [name] MRN: [id number] DOB/AGE: [birth year] 48 y.o.  Admit date: 06/29/2013 Discharge date: 07/01/2013  Admission Diagnoses:  Principal Problem:   Arthritis of right hip Active Problems:   Status post THR (total hip replacement)   Discharge Diagnoses:  Same  Past Medical History  Diagnosis Date  . Hypothyroid   . Arthritis   . Family history of anesthesia complication     mother severe N/V    Surgeries: Procedure(s): RIGHT TOTAL HIP ARTHROPLASTY ANTERIOR APPROACH on 06/29/2013   Consultants:    Discharged Condition: Improved  Hospital Course: [name] is an 48 y.o. female who was admitted 06/29/2013 for operative treatment ofArthritis of right hip. Patient has severe unremitting pain that affects sleep, daily activities, and work/hobbies. After pre-op clearance the patient was taken to the operating room on 06/29/2013 and underwent  Procedure(s): RIGHT TOTAL HIP ARTHROPLASTY ANTERIOR APPROACH.    Patient was given perioperative antibiotics: Anti-infectives   Start     Dose/Rate Route Frequency Ordered Stop   06/29/13 2000  ceFAZolin (ANCEF) IVPB 1 g/50 mL premix     1 g 100 mL/hr over 30 Minutes Intravenous Every 6 hours 06/29/13 1610 06/30/13 0301   06/29/13 1150  ceFAZolin (ANCEF) IVPB 2 g/50 mL premix     2 g 100 mL/hr over 30 Minutes Intravenous On call to O.R. 06/29/13 1150 06/29/13 1247       Patient was given sequential compression devices, early ambulation, and chemoprophylaxis to prevent DVT.  Patient benefited maximally from hospital stay and there were no complications.    Recent vital signs: Patient Vitals for the past 24 hrs:  BP Temp Temp src Pulse Resp SpO2  07/01/13 0800 - - - - 16 95 %  07/01/13 0453 103/67 mmHg 98.4 F (36.9 C) Oral 89 16 95 %  07/01/13 0400 - - - - 16 98 %  07/01/13 0000 - - - - 16 98 %  06/30/13 2120 108/72 mmHg 98.4 F (36.9 C) Oral 84 16 98 %  06/30/13 2000 - - - - 16 99 %     Recent laboratory  studies:  Recent Labs  06/30/13 0444 07/01/13 0520  WBC 7.4 11.3*  HGB 11.3* 10.9*  HCT 34.6* 32.7*  PLT 267 241  NA 139  --   K 3.9  --   CL 103  --   CO2 25  --   BUN 8  --   CREATININE 0.65  --   GLUCOSE 139*  --   CALCIUM 8.7  --      Discharge Medications:     Medication List    STOP taking these medications       ibuprofen 200 MG tablet  Commonly known as:  ADVIL,MOTRIN      TAKE these medications       acetaminophen 325 MG tablet  Commonly known as:  TYLENOL  Take 650 mg by mouth every 6 (six) hours as needed.     aspirin 325 MG EC tablet  Take 1 tablet (325 mg total) by mouth 2 (two) times daily after a meal.     levothyroxine 50 MCG tablet  Commonly known as:  SYNTHROID, LEVOTHROID  Take 50 mcg by mouth daily before breakfast.     LOESTRIN FE 1/20 1-20 MG-MCG tablet  Generic drug:  norethindrone-ethinyl estradiol  Take 1 tablet by mouth daily.     methocarbamol 500 MG tablet  Commonly known as:  ROBAXIN  Take 1 tablet (500 mg total)  by mouth every 6 (six) hours as needed for muscle spasms.     mupirocin nasal ointment 2 %  Commonly known as:  BACTROBAN  Place 1 application into the nose 2 (two) times daily. Use one-half of tube in each nostril twice daily for five (5) days. After application, press sides of nose together and gently massage.     oxyCODONE-acetaminophen 5-325 MG per tablet  Commonly known as:  ROXICET  Take 1-2 tablets by mouth every 4 (four) hours as needed for severe pain.        Diagnostic Studies: Dg Hip Complete Right  06/29/2013   CLINICAL DATA:  Right hip replacement.  EXAM: RIGHT HIP - COMPLETE 2+ VIEW; DG C-ARM 1-60 MIN - NRPT [facility]  COMPARISON:  MR HIP*R* WO/W CM dated 01/11/2012  FINDINGS: 2 intraoperative spot images demonstrate changes of right hip replacement. Normal AP alignment. No visible hardware or bony complicating feature.  IMPRESSION: Right hip replacement.  No complicating feature.   Electronically Signed    By: Charlett Nose M.D.   On: 06/29/2013 14:19   Dg Pelvis Portable  06/29/2013   CLINICAL DATA:  Postop right hip replacement.  EXAM: PORTABLE PELVIS 1-2 VIEWS  COMPARISON:  06/29/2013 intraoperative spot fluoroscopic images  FINDINGS: Single portable AP radiograph of the pelvis demonstrates sequelae of right total hip arthroplasty. The prosthetic femoral and acetabular components are approximated. There is no radiographic evidence of hardware complication. Overlying postoperative soft tissue emphysema is present. The bony pelvis appears intact.  IMPRESSION: Unremarkable appearance of right total hip arthroplasty.   Electronically Signed   By: Sebastian Ache   On: 06/29/2013 16:03   Dg Hip Portable 1 View Right  06/29/2013   CLINICAL DATA:  Postop hip replacement.  EXAM: PORTABLE RIGHT HIP - 1 VIEW  COMPARISON:  06/29/2013 intraoperative fluoroscopic images.  FINDINGS: Single lateral radiograph demonstrates a right total hip arthroplasty. The prosthetic femoral and acetabular components are approximated. No radiographic evidence of hardware complication. There is mild overlying soft tissue emphysema, postoperative in nature.  IMPRESSION: Unremarkable appearance of right total hip arthroplasty.   Electronically Signed   By: Sebastian Ache   On: 06/29/2013 16:02   Dg C-arm 1-60 Min-no Report  06/29/2013   CLINICAL DATA:  Right hip replacement.  EXAM: RIGHT HIP - COMPLETE 2+ VIEW; DG C-ARM 1-60 MIN - [facility]PT [facility]  COMPARISON:  MR HIP*R* WO/W CM dated 01/11/2012  FINDINGS: 2 intraoperative spot images demonstrate changes of right hip replacement. Normal AP alignment. No visible hardware or bony complicating feature.  IMPRESSION: Right hip replacement.  No complicating feature.   Electronically Signed   By: Charlett Nose M.D.   On: 06/29/2013 14:19    Disposition: 01-Home or Self Care      Discharge Orders   Future Orders Complete By Expires   Call MD / Call 911  As directed    Comments:     If you experience  chest pain or shortness of breath, CALL 911 and be transported to the hospital emergency room.  If you develope a fever above 101 F, pus (white drainage) or increased drainage or redness at the wound, or calf pain, call your surgeon's office.   Call MD / Call 911  As directed    Comments:     If you experience chest pain or shortness of breath, CALL 911 and be transported to the hospital emergency room.  If you develope a fever above 101.5 F, pus (white drainage) or  increased drainage or redness at the wound, or calf pain, call your surgeon's office.   Constipation Prevention  As directed    Comments:     Drink plenty of fluids.  Prune juice may be helpful.  You may use a stool softener, such as Colace (over the counter) 100 mg twice a day.  Use MiraLax (over the counter) for constipation as needed.   Constipation Prevention  As directed    Comments:     Drink plenty of fluids.  Prune juice may be helpful.  You may use a stool softener, such as Colace (over the counter) 100 mg twice a day.  Use MiraLax (over the counter) for constipation as needed.   Diet - low sodium heart healthy  As directed    Discharge instructions  As directed    Comments:     Increase your activities as comfort allows. Ice as needed for swelling. Pump your feet occasionally during the day. You can get your dressing wet in the shower. You can starting getting your actual incision wet itself starting Friday; then new dry dressing daily. Get an over-the-counter stool softener.   Driving restrictions  As directed    Comments:     No driving while taking narcotic pain meds.   Follow the hip precautions as taught in Physical Therapy  As directed    Increase activity slowly as tolerated  As directed    Increase activity slowly as tolerated  As directed    TED hose  As directed    Comments:     Use stockings (TED hose) for 6 weeks on both leg(s).  You may remove them at night for sleeping.      Follow-up Information    [facility] Health Physical Therapy)    Contact information:   [address] [place] [address] [phone]       Follow up with Kathryne HitchBLACKMAN,CHRISTOPHER Y, MD In 2 weeks.   Specialty:  Orthopedic Surgery   Contact information:   [address] [place] [address] [phone]        Signed: Kathryne HitchBLACKMAN,CHRISTOPHER Y 07/01/2013, 5:19 PM

## 2013-07-01 NOTE — Progress Notes (Signed)
Subjective:  Patient reports pain as mild.    Objective:   VITALS:   Filed Vitals:   07/01/13 0000 07/01/13 0400 07/01/13 0453 07/01/13 0800  BP:   103/67   Pulse:   89   Temp:   98.4 F (36.9 C)   TempSrc:   Oral   Resp: 16 16 16 16   Height:      Weight:      SpO2: 98% 98% 95% 95%    Exam stable Dressing c/d/i   Lab Results  Component Value Date   WBC 11.3* 07/01/2013   HGB 10.9* 07/01/2013   HCT 32.7* 07/01/2013   MCV 91.6 07/01/2013   PLT 241 07/01/2013     Assessment/Plan: 2 Days Post-Op   Problem List Items Addressed This Visit     Musculoskeletal and Integument   *Arthritis of right hip - Primary   Relevant Medications      acetaminophen (TYLENOL) 325 MG tablet      acetaminophen (TYLENOL) tablet 650 mg      aspirin EC tablet 325 mg      oxyCODONE (Oxy IR/ROXICODONE) immediate release tablet 5-10 mg      HYDROmorphone (DILAUDID) injection 1 mg      methocarbamol (ROBAXIN) tablet 500 mg      aspirin EC tablet      methocarbamol (ROBAXIN) tablet      oxyCODONE-acetaminophen (PERCOCET) tablet 5-325 mg   Other Relevant Orders      Call MD / Call 911      Diet - low sodium heart healthy      Constipation Prevention      Increase activity slowly as tolerated      Discharge instructions      Call MD / Call 911      Constipation Prevention      Increase activity slowly as tolerated      Driving restrictions      Follow the hip precautions as taught in Physical Therapy      TED hose     Other   Status post THR (total hip replacement)   Relevant Orders      Call MD / Call 911      Diet - low sodium heart healthy      Constipation Prevention      Increase activity slowly as tolerated      Discharge instructions      Expected postop acute blood loss anemia - will monitor for symptoms Up with PT/OT DVT ppx - SCDs, ambulation, asa  WBAT right lower extremity Pain control Discharge planning - dc home today after PT   Cheral AlmasXu, Naiping Michael 07/01/2013,  9:17 AM [phone]

## 2013-07-01 NOTE — Progress Notes (Signed)
Physical Therapy Treatment Patient Details Name: Kristin ButtersRosanne [name] MRN: [id number] DOB: 06/15/1965 Today's Date: 07/01/2013 Time: [id number] PT Time Calculation (min): 26 min  PT Assessment / Plan / Recommendation  History of Present Illness Pt is post-op day 2 for a right total hip arthroplasty using an anterior approach for the treatment of severe end-stage degenerative arthritis   PT Comments   Pt ambulated in the hallway, practiced stairs, and performed standing and sitting exercises.  Pt able to recall stair sequencing at end of tx.  Pt educated on use of ice at home and on sequence for safe car transfer.  Pt is progressing well towards goals and ready for discharge home.  Follow Up Recommendations  Home health PT;Supervision - Intermittent     Does the patient have the potential to tolerate intense rehabilitation     Barriers to Discharge        Equipment Recommendations  None recommended by PT    Recommendations for Other Services    Frequency 7X/week   Progress towards PT Goals Progress towards PT goals: Progressing toward goals  Plan Current plan remains appropriate    Precautions / Restrictions Precautions Precautions: None Restrictions Weight Bearing Restrictions: No RLE Weight Bearing: Weight bearing as tolerated   Pertinent Vitals/Pain Activity to tolerance.  Pt left with ice at end of treatment.    Mobility  Bed Mobility Overal bed mobility: Needs Assistance Bed Mobility: Supine to Sit Supine to sit: Supervision General bed mobility comments: verbal cues for sequence and safety Transfers Overall transfer level: Needs assistance Equipment used: Rolling walker (2 wheeled) Sit to Stand: Supervision General transfer comment: verbal cues for safety and sequence Ambulation/Gait Ambulation/Gait assistance: Supervision Ambulation Distance (Feet): 200 Feet Assistive device: Rolling walker (2 wheeled) Gait Pattern/deviations: Step-through pattern;Antalgic Gait  velocity: decr General Gait Details: verbal cues for safety, sequnce, and use of DME Stairs: Yes Stairs assistance: Supervision Stair Management: No rails;Forwards;With walker;Step to pattern Number of Stairs: 1 General stair comments: verbal cues for sequencing and safety    Exercises Total Joint Exercises Ankle Circles/Pumps: AROM;20 reps;Both Gluteal Sets: AROM;15 reps;Both Towel Squeeze: AROM;Both;15 reps Heel Slides: AROM;Right;15 reps Hip ABduction/ADduction: AROM;Right;15 reps Straight Leg Raises: AAROM;Right;15 reps Long Arc Quad: AROM;Right;15 reps Knee Flexion: AROM;Standing;Right;15 reps Marching in Standing: AROM;Seated;Both;15 reps Standing Hip Extension: AROM;15 reps;Right   PT Diagnosis:    PT Problem List:   PT Treatment Interventions:     PT Goals (current goals can now be found in the care plan section) Acute Rehab PT Goals Patient Stated Goal: Return home and increase function  PT Goal Formulation: With patient Time For Goal Achievement: 07/05/13 Potential to Achieve Goals: Good  Visit Information  Last PT Received On: 07/01/13 Assistance Needed: +1 History of Present Illness: Pt is post-op day 2 for a right total hip arthroplasty using an anterior approach for the treatment of severe end-stage degenerative arthritis    Subjective Data  Patient Stated Goal: Return home and increase function    Cognition  Cognition Arousal/Alertness: Awake/alert Behavior During Therapy: WFL for tasks assessed/performed Overall Cognitive Status: Within Functional Limits for tasks assessed    Balance     End of Session PT - End of Session Equipment Utilized During Treatment: Gait belt Activity Tolerance: Patient tolerated treatment well Patient left: with call bell/phone within reach;in chair   GP     [name], [name] 07/01/2013, 11:56 AM [name] 07/01/2013

## 2013-07-01 NOTE — Plan of Care (Signed)
Problem: Discharge Progression Outcomes Goal: Anticoagulant follow-up in place Outcome: Not Applicable Date Met:  17/47/15 Diona Fanti

## 2013-07-01 NOTE — Progress Notes (Signed)
I have reviewed this note and agree with all findings. Kati Ajee Heasley, PT, DPT Pager: [phone]   

## 2013-07-01 NOTE — Progress Notes (Signed)
Occupational Therapy Treatment Patient Details Name: [name] MRN: [id number] DOB: 1965/08/14 Today's Date: 07/01/2013 Time:  -     OT Assessment / Plan / Recommendation  History of present illness  Pt s/p R THA DA approach   OT comments  All education completed and pt has no further OT needs. Will sign off and pt discharging home today.  Follow Up Recommendations  Supervision - Intermittent    Barriers to Discharge       Equipment Recommendations  None recommended by OT    Progress towards OT Goals Progress towards OT goals: Goals met/education completed, patient discharged from [facility] All goals met and education completed, patient discharged from OT services    Precautions / Restrictions Precautions Precautions: None Restrictions Weight Bearing Restrictions: No RLE Weight Bearing: Weight bearing as tolerated   Pertinent Vitals/Pain No c/o pain    ADL  Upper Body Bathing: Independent;Performed Where Assessed - Upper Body Bathing: Unsupported sitting Lower Body Bathing: Minimal assistance;Performed Where Assessed - Lower Body Bathing: Unsupported sitting Upper Body Dressing: Performed;Independent Where Assessed - Upper Body Dressing: Unsupported sitting Lower Body Dressing: Performed;Minimal assistance Where Assessed - Lower Body Dressing: Unsupported sit to stand Tub/Shower Transfer: Simulated;Minimal assistance (to get RLE over tub) Tub/Shower Transfer Method: Stand pivot Tub/Shower Transfer Equipment:  (RW) Equipment Used: Rolling walker Transfers/Ambulation Related to ADLs: Patient needed A to get RLE over side of simulated tub but otherwise S with transfer. ADL Comments: Patient having some difficulty getting sock on/off. She will wear slip on shoes. Otherwise bathing/dressing with minimal A for LB areas she cannot reach. Patient had questions re: home health. Called care manager who will have [facility] call pt directly.     OT Goals(current goals can now  be found in the care plan section)    Visit Information  Last OT Received On: 07/01/13    Cognition  Cognition Arousal/Alertness: Awake/alert Behavior During Therapy: [facility] for tasks assessed/performed Overall Cognitive Status: Within Functional Limits for tasks assessed    End of Session OT - End of Session Equipment Utilized During Treatment: Rolling walker Activity Tolerance: Patient tolerated treatment well Patient left: in chair;with call bell/phone within reach;with family/visitor present Nurse Communication: Other (comment) (ready for d/c paperwork)  GO     Mykal Kirchman A 07/01/2013, 11:05 AM

## 2013-07-02 ENCOUNTER — Encounter (HOSPITAL_COMMUNITY): Payer: Self-pay | Admitting: Orthopaedic Surgery

## 2013-08-07 ENCOUNTER — Telehealth: Payer: Self-pay | Admitting: Family Medicine

## 2013-08-07 DIAGNOSIS — E039 Hypothyroidism, unspecified: Secondary | ICD-10-CM

## 2013-08-07 MED ORDER — LEVOTHYROXINE SODIUM 50 MCG PO TABS: 50.0000 ug | ORAL_TABLET | Freq: Every day | ORAL | Status: DC

## 2013-08-07 NOTE — Telephone Encounter (Signed)
Caller name:Jandi Parks Relation to ZO:XWRUEAVpt:patient Call back number:[phone] Pharmacy:[facility]  Reason for call: Patient called and stated that [facility] has been trying to refill her levothyroxine (SYNTHROID, LEVOTHROID) 50 MCG tablet but no response from us. Please advise.

## 2013-08-07 NOTE — Telephone Encounter (Signed)
Refill for #90 of Synthroid sent to [facility]. Patient advised to schedule CPE appt after 10/22/13.

## 2013-10-24 ENCOUNTER — Encounter: Payer: Self-pay | Admitting: Family Medicine

## 2013-10-26 ENCOUNTER — Other Ambulatory Visit (INDEPENDENT_AMBULATORY_CARE_PROVIDER_SITE_OTHER): Payer: BC Managed Care – PPO

## 2013-10-26 DIAGNOSIS — E039 Hypothyroidism, unspecified: Secondary | ICD-10-CM

## 2013-10-26 LAB — TSH: TSH: 1.04 u[IU]/mL (ref 0.35–4.50)

## 2013-11-13 ENCOUNTER — Encounter: Payer: Self-pay | Admitting: Family Medicine

## 2013-11-19 ENCOUNTER — Other Ambulatory Visit: Payer: Self-pay | Admitting: Family Medicine

## 2013-11-29 ENCOUNTER — Other Ambulatory Visit: Payer: Self-pay

## 2013-11-29 DIAGNOSIS — Z1231 Encounter for screening mammogram for malignant neoplasm of breast: Secondary | ICD-10-CM

## 2013-12-13 ENCOUNTER — Ambulatory Visit (INDEPENDENT_AMBULATORY_CARE_PROVIDER_SITE_OTHER): Payer: BC Managed Care – PPO | Admitting: Family Medicine

## 2013-12-13 ENCOUNTER — Encounter: Payer: Self-pay | Admitting: Family Medicine

## 2013-12-13 VITALS — BP 122/76 | HR 80 | Temp 98.0°F | Ht 64.5 in | Wt 185.0 lb

## 2013-12-13 DIAGNOSIS — E039 Hypothyroidism, unspecified: Secondary | ICD-10-CM

## 2013-12-13 DIAGNOSIS — E669 Obesity, unspecified: Secondary | ICD-10-CM | POA: Insufficient documentation

## 2013-12-13 DIAGNOSIS — Z0000 Encounter for general adult medical examination without abnormal findings: Secondary | ICD-10-CM

## 2013-12-13 NOTE — Progress Notes (Signed)
Subjective:     [name] is a 48 y.o. female and is here for a comprehensive physical exam. The patient reports no problems.  History   Social History  . Marital Status: Married    Spouse Name: N/A    Number of Children: N/A  . Years of Education: N/A   Occupational History  . Not on file.   Social History Main Topics  . Smoking status: Never Smoker   . Smokeless tobacco: Never Used  . Alcohol Use: Yes     Comment: Moderate  . Drug Use: No  . Sexual Activity: Not on file   Other Topics Concern  . Not on file   Social History Narrative  . No narrative on file   Health Maintenance  Topic Date Due  . Colonoscopy  06/13/2014 (Originally 05/14/2013)  . Influenza Vaccine  12/14/2014 (Originally 11/10/2013)  . Mammogram  01/10/2014  . Pap Smear  10/11/2015  . Tetanus/tdap  05/01/2020    The following portions of the patient's history were reviewed and updated as appropriate:  She  has a past medical history of Hypothyroid; Arthritis; and Family history of anesthesia complication. She  does not have any pertinent problems on file. She  has past surgical history that includes No past surgeries and Total hip arthroplasty (Right, 06/29/2013). Her family history includes Breast cancer in an other family member; Diabetes in her father and paternal grandfather; Heart disease in her father; Hypertension in her mother; Throat cancer in her father. She  reports that she has never smoked. She has never used smokeless tobacco. She reports that she drinks alcohol. She reports that she does not use illicit drugs. She has a current medication list which includes the following prescription(s): levothyroxine and norethindrone-ethinyl estradiol. Current Outpatient Prescriptions on File Prior to Visit  Medication Sig Dispense Refill  . levothyroxine (SYNTHROID, LEVOTHROID) 50 MCG tablet TAKE 1 TABLET DAILY BEFORE BREAKFAST  (PLEASE CALL OUR OFFICE TO SCHEDULE YOUR COMPLETE PHYSICAL FOR October 22, 2013 OR AFTER)  90 tablet  0  . norethindrone-ethinyl estradiol (LOESTRIN FE 1/20) 1-20 MG-MCG tablet Take 1 tablet by mouth daily.       No current facility-administered medications on file prior to visit.   She has No Known Allergies..  Review of Systems Review of Systems  Constitutional: Negative for activity change, appetite change and fatigue.  HENT: Negative for hearing loss, congestion, tinnitus and ear discharge.  dentist- no Eyes: Negative for visual disturbance (see optho q1y - no) Respiratory: Negative for cough, chest tightness and shortness of breath.   Cardiovascular: Negative for chest pain, palpitations and leg swelling.  Gastrointestinal: Negative for abdominal pain, diarrhea, constipation and abdominal distention.  Genitourinary: Negative for urgency, frequency, decreased urine volume and difficulty urinating.  Musculoskeletal: Negative for back pain, arthralgias and gait problem.  Skin: Negative for color change, pallor and rash.  Neurological: Negative for dizziness, light-headedness, numbness and headaches.  Hematological: Negative for adenopathy. Does not bruise/bleed easily.  Psychiatric/Behavioral: Negative for suicidal ideas, confusion, sleep disturbance, self-injury, dysphoric mood, decreased concentration and agitation.       Objective:    BP 122/76  Pulse 80  Temp(Src) 98 F (36.7 C) (Oral)  Ht 5' 4.5" (1.638 m)  Wt 184 lb 15.5 oz (83.9 kg)  BMI 31.27 kg/m2  SpO2 98% General appearance: alert, cooperative, appears stated age and no distress Head: Normocephalic, without obvious abnormality, atraumatic Eyes: conjunctivae/corneas clear. PERRL, EOM's intact. Fundi benign. Ears: normal TM's and external ear  canals both ears Nose: Nares normal. Septum midline. Mucosa normal. No drainage or sinus tenderness. Throat: lips, mucosa, and tongue normal; teeth and gums normal Neck: no adenopathy, supple, symmetrical, trachea midline and thyroid not  enlarged, symmetric, no tenderness/mass/nodules Back: symmetric, no curvature. ROM normal. No CVA tenderness. Lungs: clear to auscultation bilaterally Breasts: gyn Heart: regular rate and rhythm, S1, S2 normal, no murmur, click, rub or gallop Abdomen: soft, non-tender; bowel sounds normal; no masses,  no organomegaly Pelvic: deferred--gyn Extremities: extremities normal, atraumatic, no cyanosis or edema Pulses: 2+ and symmetric Skin: Skin color, texture, turgor normal. No rashes or lesions Lymph nodes: Cervical, supraclavicular, and axillary nodes normal. Neurologic: Alert and oriented X 3, normal strength and tone. Normal symmetric reflexes. Normal coordination and gait Psych--no depression, anxiety      Assessment:    Healthy female exam.      Plan:    ghm utd-- colon to be done in fall Check labs See After Visit Summary for Counseling Recommendations   1. Preventative health care   - Basic metabolic panel; Future - CBC with Differential; Future - Hepatic function panel; Future - Lipid panel; Future - POCT urinalysis dipstick; Future  2. Unspecified hypothyroidism Lab Results  Component Value Date   TSH 1.04 10/26/2013   con't synthroid

## 2013-12-13 NOTE — Patient Instructions (Signed)
Preventive Care for Adults A healthy lifestyle and preventive care can promote health and wellness. Preventive health guidelines for women include the following key practices.  A routine yearly physical is a good way to check with your health care provider about your health and preventive screening. It is a chance to share any concerns and updates on your health and to receive a thorough exam.  Visit your dentist for a routine exam and preventive care every 6 months. Brush your teeth twice a day and floss once a day. Good oral hygiene prevents tooth decay and gum disease.  The frequency of eye exams is based on your age, health, family medical history, use of contact lenses, and other factors. Follow your health care provider's recommendations for frequency of eye exams.  Eat a healthy diet. Foods like vegetables, fruits, whole grains, low-fat dairy products, and lean protein foods contain the nutrients you need without too many calories. Decrease your intake of foods high in solid fats, added sugars, and salt. Eat the right amount of calories for you.Get information about a proper diet from your health care provider, if necessary.  Regular physical exercise is one of the most important things you can do for your health. Most adults should get at least 150 minutes of moderate-intensity exercise (any activity that increases your heart rate and causes you to sweat) each week. In addition, most adults need muscle-strengthening exercises on 2 or more days a week.  Maintain a healthy weight. The body mass index (BMI) is a screening tool to identify possible weight problems. It provides an estimate of body fat based on height and weight. Your health care provider can find your BMI and can help you achieve or maintain a healthy weight.For adults 20 years and older:  A BMI below 18.5 is considered underweight.  A BMI of 18.5 to 24.9 is normal.  A BMI of 25 to 29.9 is considered overweight.  A BMI of  30 and above is considered obese.  Maintain normal blood lipids and cholesterol levels by exercising and minimizing your intake of saturated fat. Eat a balanced diet with plenty of fruit and vegetables. Blood tests for lipids and cholesterol should begin at age 76 and be repeated every 5 years. If your lipid or cholesterol levels are high, you are over 50, or you are at high risk for heart disease, you may need your cholesterol levels checked more frequently.Ongoing high lipid and cholesterol levels should be treated with medicines if diet and exercise are not working.  If you smoke, find out from your health care provider how to quit. If you do not use tobacco, do not start.  Lung cancer screening is recommended for adults aged 22-80 years who are at high risk for developing lung cancer because of a history of smoking. A yearly low-dose CT scan of the lungs is recommended for people who have at least a 30-pack-year history of smoking and are a current smoker or have quit within the past 15 years. A pack year of smoking is smoking an average of 1 pack of cigarettes a day for 1 year (for example: 1 pack a day for 30 years or 2 packs a day for 15 years). Yearly screening should continue until the smoker has stopped smoking for at least 15 years. Yearly screening should be stopped for people who develop a health problem that would prevent them from having lung cancer treatment.  If you are pregnant, do not drink alcohol. If you are breastfeeding,  be very cautious about drinking alcohol. If you are not pregnant and choose to drink alcohol, do not have more than 1 drink per day. One drink is considered to be 12 ounces (355 mL) of beer, 5 ounces (148 mL) of wine, or 1.5 ounces (44 mL) of liquor.  Avoid use of street drugs. Do not share needles with anyone. Ask for help if you need support or instructions about stopping the use of drugs.  High blood pressure causes heart disease and increases the risk of  stroke. Your blood pressure should be checked at least every 1 to 2 years. Ongoing high blood pressure should be treated with medicines if weight loss and exercise do not work.  If you are 75-52 years old, ask your health care provider if you should take aspirin to prevent strokes.  Diabetes screening involves taking a blood sample to check your fasting blood sugar level. This should be done once every 3 years, after age 15, if you are within normal weight and without risk factors for diabetes. Testing should be considered at a younger age or be carried out more frequently if you are overweight and have at least 1 risk factor for diabetes.  Breast cancer screening is essential preventive care for women. You should practice "breast self-awareness." This means understanding the normal appearance and feel of your breasts and may include breast self-examination. Any changes detected, no matter how small, should be reported to a health care provider. Women in their 58s and 30s should have a clinical breast exam (CBE) by a health care provider as part of a regular health exam every 1 to 3 years. After age 16, women should have a CBE every year. Starting at age 53, women should consider having a mammogram (breast X-ray test) every year. Women who have a family history of breast cancer should talk to their health care provider about genetic screening. Women at a high risk of breast cancer should talk to their health care providers about having an MRI and a mammogram every year.  Breast cancer gene (BRCA)-related cancer risk assessment is recommended for women who have family members with BRCA-related cancers. BRCA-related cancers include breast, ovarian, tubal, and peritoneal cancers. Having family members with these cancers may be associated with an increased risk for harmful changes (mutations) in the breast cancer genes BRCA1 and BRCA2. Results of the assessment will determine the need for genetic counseling and  BRCA1 and BRCA2 testing.  Routine pelvic exams to screen for cancer are no longer recommended for nonpregnant women who are considered low risk for cancer of the pelvic organs (ovaries, uterus, and vagina) and who do not have symptoms. Ask your health care provider if a screening pelvic exam is right for you.  If you have had past treatment for cervical cancer or a condition that could lead to cancer, you need Pap tests and screening for cancer for at least 20 years after your treatment. If Pap tests have been discontinued, your risk factors (such as having a new sexual partner) need to be reassessed to determine if screening should be resumed. Some women have medical problems that increase the chance of getting cervical cancer. In these cases, your health care provider may recommend more frequent screening and Pap tests.  The HPV test is an additional test that may be used for cervical cancer screening. The HPV test looks for the virus that can cause the cell changes on the cervix. The cells collected during the Pap test can be  tested for HPV. The HPV test could be used to screen women aged 30 years and older, and should be used in women of any age who have unclear Pap test results. After the age of 30, women should have HPV testing at the same frequency as a Pap test.  Colorectal cancer can be detected and often prevented. Most routine colorectal cancer screening begins at the age of 50 years and continues through age 75 years. However, your health care provider may recommend screening at an earlier age if you have risk factors for colon cancer. On a yearly basis, your health care provider may provide home test kits to check for hidden blood in the stool. Use of a small camera at the end of a tube, to directly examine the colon (sigmoidoscopy or colonoscopy), can detect the earliest forms of colorectal cancer. Talk to your health care provider about this at age 50, when routine screening begins. Direct  exam of the colon should be repeated every 5-10 years through age 75 years, unless early forms of pre-cancerous polyps or small growths are found.  People who are at an increased risk for hepatitis B should be screened for this virus. You are considered at high risk for hepatitis B if:  You were born in a country where hepatitis B occurs often. Talk with your health care provider about which countries are considered high risk.  Your parents were born in a high-risk country and you have not received a shot to protect against hepatitis B (hepatitis B vaccine).  You have HIV or AIDS.  You use needles to inject street drugs.  You live with, or have sex with, someone who has hepatitis B.  You get hemodialysis treatment.  You take certain medicines for conditions like cancer, organ transplantation, and autoimmune conditions.  Hepatitis C blood testing is recommended for all people born from 1945 through 1965 and any individual with known risks for hepatitis C.  Practice safe sex. Use condoms and avoid high-risk sexual practices to reduce the spread of sexually transmitted infections (STIs). STIs include gonorrhea, chlamydia, syphilis, trichomonas, herpes, HPV, and human immunodeficiency virus (HIV). Herpes, HIV, and HPV are viral illnesses that have no cure. They can result in disability, cancer, and death.  You should be screened for sexually transmitted illnesses (STIs) including gonorrhea and chlamydia if:  You are sexually active and are younger than 24 years.  You are older than 24 years and your health care provider tells you that you are at risk for this type of infection.  Your sexual activity has changed since you were last screened and you are at an increased risk for chlamydia or gonorrhea. Ask your health care provider if you are at risk.  If you are at risk of being infected with HIV, it is recommended that you take a prescription medicine daily to prevent HIV infection. This is  called preexposure prophylaxis (PrEP). You are considered at risk if:  You are a heterosexual woman, are sexually active, and are at increased risk for HIV infection.  You take drugs by injection.  You are sexually active with a partner who has HIV.  Talk with your health care provider about whether you are at high risk of being infected with HIV. If you choose to begin PrEP, you should first be tested for HIV. You should then be tested every 3 months for as long as you are taking PrEP.  Osteoporosis is a disease in which the bones lose minerals and strength   with aging. This can result in serious bone fractures or breaks. The risk of osteoporosis can be identified using a bone density scan. Women ages 65 years and over and women at risk for fractures or osteoporosis should discuss screening with their health care providers. Ask your health care provider whether you should take a calcium supplement or vitamin D to reduce the rate of osteoporosis.  Menopause can be associated with physical symptoms and risks. Hormone replacement therapy is available to decrease symptoms and risks. You should talk to your health care provider about whether hormone replacement therapy is right for you.  Use sunscreen. Apply sunscreen liberally and repeatedly throughout the day. You should seek shade when your shadow is shorter than you. Protect yourself by wearing long sleeves, pants, a wide-brimmed hat, and sunglasses year round, whenever you are outdoors.  Once a month, do a whole body skin exam, using a mirror to look at the skin on your back. Tell your health care provider of new moles, moles that have irregular borders, moles that are larger than a pencil eraser, or moles that have changed in shape or color.  Stay current with required vaccines (immunizations).  Influenza vaccine. All adults should be immunized every year.  Tetanus, diphtheria, and acellular pertussis (Td, Tdap) vaccine. Pregnant women should  receive 1 dose of Tdap vaccine during each pregnancy. The dose should be obtained regardless of the length of time since the last dose. Immunization is preferred during the 27th-36th week of gestation. An adult who has not previously received Tdap or who does not know her vaccine status should receive 1 dose of Tdap. This initial dose should be followed by tetanus and diphtheria toxoids (Td) booster doses every 10 years. Adults with an unknown or incomplete history of completing a 3-dose immunization series with Td-containing vaccines should begin or complete a primary immunization series including a Tdap dose. Adults should receive a Td booster every 10 years.  Varicella vaccine. An adult without evidence of immunity to varicella should receive 2 doses or a second dose if she has previously received 1 dose. Pregnant females who do not have evidence of immunity should receive the first dose after pregnancy. This first dose should be obtained before leaving the health care facility. The second dose should be obtained 4-8 weeks after the first dose.  Human papillomavirus (HPV) vaccine. Females aged 13-26 years who have not received the vaccine previously should obtain the 3-dose series. The vaccine is not recommended for use in pregnant females. However, pregnancy testing is not needed before receiving a dose. If a female is found to be pregnant after receiving a dose, no treatment is needed. In that case, the remaining doses should be delayed until after the pregnancy. Immunization is recommended for any person with an immunocompromised condition through the age of 26 years if she did not get any or all doses earlier. During the 3-dose series, the second dose should be obtained 4-8 weeks after the first dose. The third dose should be obtained 24 weeks after the first dose and 16 weeks after the second dose.  Zoster vaccine. One dose is recommended for adults aged 60 years or older unless certain conditions are  present.  Measles, mumps, and rubella (MMR) vaccine. Adults born before 1957 generally are considered immune to measles and mumps. Adults born in 1957 or later should have 1 or more doses of MMR vaccine unless there is a contraindication to the vaccine or there is laboratory evidence of immunity to   each of the three diseases. A routine second dose of MMR vaccine should be obtained at least 28 days after the first dose for students attending postsecondary schools, health care workers, or international travelers. People who received inactivated measles vaccine or an unknown type of measles vaccine during 1963-1967 should receive 2 doses of MMR vaccine. People who received inactivated mumps vaccine or an unknown type of mumps vaccine before 1979 and are at high risk for mumps infection should consider immunization with 2 doses of MMR vaccine. For females of childbearing age, rubella immunity should be determined. If there is no evidence of immunity, females who are not pregnant should be vaccinated. If there is no evidence of immunity, females who are pregnant should delay immunization until after pregnancy. Unvaccinated health care workers born before 1957 who lack laboratory evidence of measles, mumps, or rubella immunity or laboratory confirmation of disease should consider measles and mumps immunization with 2 doses of MMR vaccine or rubella immunization with 1 dose of MMR vaccine.  Pneumococcal 13-valent conjugate (PCV13) vaccine. When indicated, a person who is uncertain of her immunization history and has no record of immunization should receive the PCV13 vaccine. An adult aged 19 years or older who has certain medical conditions and has not been previously immunized should receive 1 dose of PCV13 vaccine. This PCV13 should be followed with a dose of pneumococcal polysaccharide (PPSV23) vaccine. The PPSV23 vaccine dose should be obtained at least 8 weeks after the dose of PCV13 vaccine. An adult aged 19  years or older who has certain medical conditions and previously received 1 or more doses of PPSV23 vaccine should receive 1 dose of PCV13. The PCV13 vaccine dose should be obtained 1 or more years after the last PPSV23 vaccine dose.  Pneumococcal polysaccharide (PPSV23) vaccine. When PCV13 is also indicated, PCV13 should be obtained first. All adults aged 65 years and older should be immunized. An adult younger than age 65 years who has certain medical conditions should be immunized. Any person who resides in a nursing home or long-term care facility should be immunized. An adult smoker should be immunized. People with an immunocompromised condition and certain other conditions should receive both PCV13 and PPSV23 vaccines. People with human immunodeficiency virus (HIV) infection should be immunized as soon as possible after diagnosis. Immunization during chemotherapy or radiation therapy should be avoided. Routine use of PPSV23 vaccine is not recommended for American Indians, [place] Natives, or people younger than 65 years unless there are medical conditions that require PPSV23 vaccine. When indicated, people who have unknown immunization and have no record of immunization should receive PPSV23 vaccine. One-time revaccination 5 years after the first dose of PPSV23 is recommended for people aged 19-64 years who have chronic kidney failure, nephrotic syndrome, asplenia, or immunocompromised conditions. People who received 1-2 doses of PPSV23 before age 65 years should receive another dose of PPSV23 vaccine at age 65 years or later if at least 5 years have passed since the previous dose. Doses of PPSV23 are not needed for people immunized with PPSV23 at or after age 65 years.  Meningococcal vaccine. Adults with asplenia or persistent complement component deficiencies should receive 2 doses of quadrivalent meningococcal conjugate (MenACWY-D) vaccine. The doses should be obtained at least 2 months apart.  Microbiologists working with certain meningococcal bacteria, military recruits, people at risk during an outbreak, and people who travel to or live in countries with a high rate of meningitis should be immunized. A first-year college student up through age   21 years who is living in a residence hall should receive a dose if she did not receive a dose on or after her 16th birthday. Adults who have certain high-risk conditions should receive one or more doses of vaccine.  Hepatitis A vaccine. Adults who wish to be protected from this disease, have certain high-risk conditions, work with hepatitis A-infected animals, work in hepatitis A research labs, or travel to or work in countries with a high rate of hepatitis A should be immunized. Adults who were previously unvaccinated and who anticipate close contact with an international adoptee during the first 60 days after arrival in the [place] from a country with a high rate of hepatitis A should be immunized.  Hepatitis B vaccine. Adults who wish to be protected from this disease, have certain high-risk conditions, may be exposed to blood or other infectious body fluids, are household contacts or sex partners of hepatitis B positive people, are clients or workers in certain care facilities, or travel to or work in countries with a high rate of hepatitis B should be immunized.  Haemophilus influenzae type b (Hib) vaccine. A previously unvaccinated person with asplenia or sickle cell disease or having a scheduled splenectomy should receive 1 dose of Hib vaccine. Regardless of previous immunization, a recipient of a hematopoietic stem cell transplant should receive a 3-dose series 6-12 months after her successful transplant. Hib vaccine is not recommended for adults with HIV infection. Preventive Services / Frequency Ages 64 to 68 years  Blood pressure check.** / Every 1 to 2 years.  Lipid and cholesterol check.** / Every 5 years beginning at age  22.  Clinical breast exam.** / Every 3 years for women in their 88s and 53s.  BRCA-related cancer risk assessment.** / For women who have family members with a BRCA-related cancer (breast, ovarian, tubal, or peritoneal cancers).  Pap test.** / Every 2 years from ages 90 through 51. Every 3 years starting at age 21 through age 56 or 3 with a history of 3 consecutive normal Pap tests.  HPV screening.** / Every 3 years from ages 24 through ages 1 to 46 with a history of 3 consecutive normal Pap tests.  Hepatitis C blood test.** / For any individual with known risks for hepatitis C.  Skin self-exam. / Monthly.  Influenza vaccine. / Every year.  Tetanus, diphtheria, and acellular pertussis (Tdap, Td) vaccine.** / Consult your health care provider. Pregnant women should receive 1 dose of Tdap vaccine during each pregnancy. 1 dose of Td every 10 years.  Varicella vaccine.** / Consult your health care provider. Pregnant females who do not have evidence of immunity should receive the first dose after pregnancy.  HPV vaccine. / 3 doses over 6 months, if 72 and younger. The vaccine is not recommended for use in pregnant females. However, pregnancy testing is not needed before receiving a dose.  Measles, mumps, rubella (MMR) vaccine.** / You need at least 1 dose of MMR if you were born in 1957 or later. You may also need a 2nd dose. For females of childbearing age, rubella immunity should be determined. If there is no evidence of immunity, females who are not pregnant should be vaccinated. If there is no evidence of immunity, females who are pregnant should delay immunization until after pregnancy.  Pneumococcal 13-valent conjugate (PCV13) vaccine.** / Consult your health care provider.  Pneumococcal polysaccharide (PPSV23) vaccine.** / 1 to 2 doses if you smoke cigarettes or if you have certain conditions.  Meningococcal vaccine.** /  1 dose if you are age 19 to 21 years and a first-year college  student living in a residence hall, or have one of several medical conditions, you need to get vaccinated against meningococcal disease. You may also need additional booster doses.  Hepatitis A vaccine.** / Consult your health care provider.  Hepatitis B vaccine.** / Consult your health care provider.  Haemophilus influenzae type b (Hib) vaccine.** / Consult your health care provider. Ages 40 to 64 years  Blood pressure check.** / Every 1 to 2 years.  Lipid and cholesterol check.** / Every 5 years beginning at age 20 years.  Lung cancer screening. / Every year if you are aged 55-80 years and have a 30-pack-year history of smoking and currently smoke or have quit within the past 15 years. Yearly screening is stopped once you have quit smoking for at least 15 years or develop a health problem that would prevent you from having lung cancer treatment.  Clinical breast exam.** / Every year after age 40 years.  BRCA-related cancer risk assessment.** / For women who have family members with a BRCA-related cancer (breast, ovarian, tubal, or peritoneal cancers).  Mammogram.** / Every year beginning at age 40 years and continuing for as long as you are in good health. Consult with your health care provider.  Pap test.** / Every 3 years starting at age 30 years through age 65 or 70 years with a history of 3 consecutive normal Pap tests.  HPV screening.** / Every 3 years from ages 30 years through ages 65 to 70 years with a history of 3 consecutive normal Pap tests.  Fecal occult blood test (FOBT) of stool. / Every year beginning at age 50 years and continuing until age 75 years. You may not need to do this test if you get a colonoscopy every 10 years.  Flexible sigmoidoscopy or colonoscopy.** / Every 5 years for a flexible sigmoidoscopy or every 10 years for a colonoscopy beginning at age 50 years and continuing until age 75 years.  Hepatitis C blood test.** / For all people born from 1945 through  1965 and any individual with known risks for hepatitis C.  Skin self-exam. / Monthly.  Influenza vaccine. / Every year.  Tetanus, diphtheria, and acellular pertussis (Tdap/Td) vaccine.** / Consult your health care provider. Pregnant women should receive 1 dose of Tdap vaccine during each pregnancy. 1 dose of Td every 10 years.  Varicella vaccine.** / Consult your health care provider. Pregnant females who do not have evidence of immunity should receive the first dose after pregnancy.  Zoster vaccine.** / 1 dose for adults aged 60 years or older.  Measles, mumps, rubella (MMR) vaccine.** / You need at least 1 dose of MMR if you were born in 1957 or later. You may also need a 2nd dose. For females of childbearing age, rubella immunity should be determined. If there is no evidence of immunity, females who are not pregnant should be vaccinated. If there is no evidence of immunity, females who are pregnant should delay immunization until after pregnancy.  Pneumococcal 13-valent conjugate (PCV13) vaccine.** / Consult your health care provider.  Pneumococcal polysaccharide (PPSV23) vaccine.** / 1 to 2 doses if you smoke cigarettes or if you have certain conditions.  Meningococcal vaccine.** / Consult your health care provider.  Hepatitis A vaccine.** / Consult your health care provider.  Hepatitis B vaccine.** / Consult your health care provider.  Haemophilus influenzae type b (Hib) vaccine.** / Consult your health care provider. Ages 65   years and over  Blood pressure check.** / Every 1 to 2 years.  Lipid and cholesterol check.** / Every 5 years beginning at age 22 years.  Lung cancer screening. / Every year if you are aged 73-80 years and have a 30-pack-year history of smoking and currently smoke or have quit within the past 15 years. Yearly screening is stopped once you have quit smoking for at least 15 years or develop a health problem that would prevent you from having lung cancer  treatment.  Clinical breast exam.** / Every year after age 4 years.  BRCA-related cancer risk assessment.** / For women who have family members with a BRCA-related cancer (breast, ovarian, tubal, or peritoneal cancers).  Mammogram.** / Every year beginning at age 40 years and continuing for as long as you are in good health. Consult with your health care provider.  Pap test.** / Every 3 years starting at age 9 years through age 34 or 91 years with 3 consecutive normal Pap tests. Testing can be stopped between 65 and 70 years with 3 consecutive normal Pap tests and no abnormal Pap or HPV tests in the past 10 years.  HPV screening.** / Every 3 years from ages 57 years through ages 64 or 45 years with a history of 3 consecutive normal Pap tests. Testing can be stopped between 65 and 70 years with 3 consecutive normal Pap tests and no abnormal Pap or HPV tests in the past 10 years.  Fecal occult blood test (FOBT) of stool. / Every year beginning at age 15 years and continuing until age 17 years. You may not need to do this test if you get a colonoscopy every 10 years.  Flexible sigmoidoscopy or colonoscopy.** / Every 5 years for a flexible sigmoidoscopy or every 10 years for a colonoscopy beginning at age 86 years and continuing until age 71 years.  Hepatitis C blood test.** / For all people born from [birth year] through 1967 and any individual with known risks for hepatitis C.  Osteoporosis screening.** / A one-time screening for women ages 83 years and over and women at risk for fractures or osteoporosis.  Skin self-exam. / Monthly.  Influenza vaccine. / Every year.  Tetanus, diphtheria, and acellular pertussis (Tdap/Td) vaccine.** / 1 dose of Td every 10 years.  Varicella vaccine.** / Consult your health care provider.  Zoster vaccine.** / 1 dose for adults aged 61 years or older.  Pneumococcal 13-valent conjugate (PCV13) vaccine.** / Consult your health care provider.  Pneumococcal  polysaccharide (PPSV23) vaccine.** / 1 dose for all adults aged 28 years and older.  Meningococcal vaccine.** / Consult your health care provider.  Hepatitis A vaccine.** / Consult your health care provider.  Hepatitis B vaccine.** / Consult your health care provider.  Haemophilus influenzae type b (Hib) vaccine.** / Consult your health care provider. ** Family history and personal history of risk and conditions may change your health care provider's recommendations. Document Released: 05/25/2001 Document Revised: 08/13/2013 Document Reviewed: 08/24/2010 [facility] Patient Information 2015 [place], [place]. This information is not intended to replace advice given to you by your health care provider. Make sure you discuss any questions you have with your health care provider.

## 2013-12-13 NOTE — Progress Notes (Signed)
Pre visit review using our clinic review tool, if applicable. No additional management support is needed unless otherwise documented below in the visit note. 

## 2013-12-18 ENCOUNTER — Other Ambulatory Visit (INDEPENDENT_AMBULATORY_CARE_PROVIDER_SITE_OTHER): Payer: BC Managed Care – PPO

## 2013-12-18 DIAGNOSIS — Z0000 Encounter for general adult medical examination without abnormal findings: Secondary | ICD-10-CM

## 2013-12-18 LAB — POCT URINALYSIS DIPSTICK
BILIRUBIN UA: NEGATIVE
GLUCOSE UA: NEGATIVE
KETONES UA: NEGATIVE
LEUKOCYTES UA: NEGATIVE
Nitrite, UA: NEGATIVE
PH UA: 5
Protein, UA: NEGATIVE
RBC UA: NEGATIVE
Spec Grav, UA: >=1.03
Urobilinogen, UA: 2

## 2013-12-18 LAB — BASIC METABOLIC PANEL
BUN: 15 mg/dL (ref 6–23)
CALCIUM: 8.7 mg/dL (ref 8.4–10.5)
CO2: 26 meq/L (ref 19–32)
Chloride: 105 mEq/L (ref 96–112)
Creatinine, Ser: 0.7 mg/dL (ref 0.4–1.2)
GFR: 89.48 mL/min (ref >60.00)
GLUCOSE: 83 mg/dL (ref 70–99)
Potassium: 3.6 mEq/L (ref 3.5–5.1)
SODIUM: 138 meq/L (ref 135–145)

## 2013-12-18 LAB — CBC WITH DIFFERENTIAL/PLATELET
BASOS ABS: 0 10*3/uL (ref 0.0–0.1)
Basophils Relative: 0.6 % (ref 0.0–3.0)
EOS ABS: 0.1 10*3/uL (ref 0.0–0.7)
Eosinophils Relative: 1.1 % (ref 0.0–5.0)
HEMATOCRIT: 39.1 % (ref 36.0–46.0)
HEMOGLOBIN: 13 g/dL (ref 12.0–15.0)
LYMPHS ABS: 2 10*3/uL (ref 0.7–4.0)
LYMPHS PCT: 24.4 % (ref 12.0–46.0)
MCHC: 33.2 g/dL (ref 30.0–36.0)
MCV: 93.6 fl (ref 78.0–100.0)
MONO ABS: 0.6 10*3/uL (ref 0.1–1.0)
Monocytes Relative: 6.9 % (ref 3.0–12.0)
NEUTROS ABS: 5.5 10*3/uL (ref 1.4–7.7)
Neutrophils Relative %: 67 % (ref 43.0–77.0)
Platelets: 276 10*3/uL (ref 150.0–400.0)
RBC: 4.18 Mil/uL (ref 3.87–5.11)
RDW: 13.6 % (ref 11.5–15.5)
WBC: 8.2 10*3/uL (ref 4.0–10.5)

## 2013-12-18 LAB — LIPID PANEL
CHOLESTEROL: 191 mg/dL (ref 0–200)
HDL: 49.9 mg/dL (ref >39.00)
LDL Cholesterol: 115 mg/dL — ABNORMAL HIGH (ref 0–99)
NonHDL: 141.1
Total CHOL/HDL Ratio: 4
Triglycerides: 131 mg/dL (ref 0.0–149.0)
VLDL: 26.2 mg/dL (ref 0.0–40.0)

## 2013-12-18 LAB — HEPATIC FUNCTION PANEL
ALBUMIN: 3.6 g/dL (ref 3.5–5.2)
ALT: 20 U/L (ref 0–35)
AST: 20 U/L (ref 0–37)
Alkaline Phosphatase: 50 U/L (ref 39–117)
Bilirubin, Direct: 0 mg/dL (ref 0.0–0.3)
Total Bilirubin: 0.4 mg/dL (ref 0.2–1.2)
Total Protein: 7.2 g/dL (ref 6.0–8.3)

## 2014-01-15 ENCOUNTER — Ambulatory Visit
Admission: RE | Admit: 2014-01-15 | Discharge: 2014-01-15 | Disposition: A | Discharge status: Home / Self Care | Payer: BC Managed Care – PPO | Source: Ambulatory Visit

## 2014-01-15 DIAGNOSIS — Z1231 Encounter for screening mammogram for malignant neoplasm of breast: Secondary | ICD-10-CM

## 2014-01-26 ENCOUNTER — Other Ambulatory Visit: Payer: Self-pay | Admitting: Family Medicine

## 2014-03-01 LAB — HM COLONOSCOPY

## 2014-03-28 ENCOUNTER — Encounter: Payer: Self-pay | Admitting: *Deleted

## 2014-04-09 ENCOUNTER — Encounter: Payer: Self-pay | Admitting: Family Medicine

## 2014-07-26 ENCOUNTER — Other Ambulatory Visit: Payer: Self-pay

## 2014-07-26 MED ORDER — LEVOTHYROXINE SODIUM 50 MCG PO TABS: 50.0000 ug | ORAL_TABLET | Freq: Every day | ORAL | Status: DC

## 2014-10-29 ENCOUNTER — Other Ambulatory Visit: Payer: Self-pay | Admitting: Nurse Practitioner

## 2014-10-29 ENCOUNTER — Other Ambulatory Visit (HOSPITAL_COMMUNITY)
Admission: RE | Admit: 2014-10-29 | Discharge: 2014-10-29 | Disposition: A | Discharge status: Home / Self Care | Payer: BC Managed Care – PPO | Source: Ambulatory Visit | Attending: Nurse Practitioner | Admitting: Nurse Practitioner

## 2014-10-29 DIAGNOSIS — Z01419 Encounter for gynecological examination (general) (routine) without abnormal findings: Secondary | ICD-10-CM | POA: Diagnosis not present

## 2014-10-29 DIAGNOSIS — Z1151 Encounter for screening for human papillomavirus (HPV): Secondary | ICD-10-CM | POA: Diagnosis present

## 2014-10-30 LAB — CYTOLOGY - PAP

## 2014-12-30 ENCOUNTER — Other Ambulatory Visit: Payer: Self-pay | Admitting: Family Medicine

## 2014-12-30 NOTE — Telephone Encounter (Signed)
Patient needs an follow up with [facility]. Please schedule.      KP

## 2014-12-31 ENCOUNTER — Other Ambulatory Visit: Payer: Self-pay

## 2014-12-31 DIAGNOSIS — Z1231 Encounter for screening mammogram for malignant neoplasm of breast: Secondary | ICD-10-CM

## 2015-01-03 ENCOUNTER — Encounter: Payer: Self-pay | Admitting: Family Medicine

## 2015-01-03 MED ORDER — LEVOTHYROXINE SODIUM 50 MCG PO TABS: 50.0000 ug | ORAL_TABLET | Freq: Every day | ORAL | Status: DC

## 2015-01-09 NOTE — Telephone Encounter (Signed)
LVM advising patient of message below °

## 2015-01-17 ENCOUNTER — Ambulatory Visit
Admission: RE | Admit: 2015-01-17 | Discharge: 2015-01-17 | Disposition: A | Discharge status: Home / Self Care | Payer: BC Managed Care – PPO | Source: Ambulatory Visit

## 2015-01-17 DIAGNOSIS — Z1231 Encounter for screening mammogram for malignant neoplasm of breast: Secondary | ICD-10-CM

## 2015-04-04 ENCOUNTER — Other Ambulatory Visit: Payer: Self-pay | Admitting: Family Medicine

## 2015-04-09 ENCOUNTER — Telehealth: Payer: Self-pay

## 2015-04-09 NOTE — Telephone Encounter (Signed)
Pre visit call completed 

## 2015-04-10 ENCOUNTER — Ambulatory Visit (INDEPENDENT_AMBULATORY_CARE_PROVIDER_SITE_OTHER): Payer: BC Managed Care – PPO | Admitting: Family Medicine

## 2015-04-10 ENCOUNTER — Encounter: Payer: Self-pay | Admitting: Family Medicine

## 2015-04-10 VITALS — BP 110/78 | HR 70 | Temp 98.1°F | Ht 65.0 in | Wt 187.8 lb

## 2015-04-10 DIAGNOSIS — E039 Hypothyroidism, unspecified: Secondary | ICD-10-CM

## 2015-04-10 DIAGNOSIS — Z1159 Encounter for screening for other viral diseases: Secondary | ICD-10-CM | POA: Diagnosis not present

## 2015-04-10 DIAGNOSIS — Z114 Encounter for screening for human immunodeficiency virus [HIV]: Secondary | ICD-10-CM

## 2015-04-10 DIAGNOSIS — Z0000 Encounter for general adult medical examination without abnormal findings: Secondary | ICD-10-CM

## 2015-04-10 MED ORDER — LEVOTHYROXINE SODIUM 50 MCG PO TABS: 50.0000 ug | ORAL_TABLET | Freq: Every day | ORAL | Status: DC

## 2015-04-10 NOTE — Patient Instructions (Signed)
Preventive Care for Adults, Female A healthy lifestyle and preventive care can promote health and wellness. Preventive health guidelines for women include the following key practices.  A routine yearly physical is a good way to check with your health care provider about your health and preventive screening. It is a chance to share any concerns and updates on your health and to receive a thorough exam.  Visit your dentist for a routine exam and preventive care every 6 months. Brush your teeth twice a day and floss once a day. Good oral hygiene prevents tooth decay and gum disease.  The frequency of eye exams is based on your age, health, family medical history, use of contact lenses, and other factors. Follow your health care provider's recommendations for frequency of eye exams.  Eat a healthy diet. Foods like vegetables, fruits, whole grains, low-fat dairy products, and lean protein foods contain the nutrients you need without too many calories. Decrease your intake of foods high in solid fats, added sugars, and salt. Eat the right amount of calories for you.Get information about a proper diet from your health care provider, if necessary.  Regular physical exercise is one of the most important things you can do for your health. Most adults should get at least 150 minutes of moderate-intensity exercise (any activity that increases your heart rate and causes you to sweat) each week. In addition, most adults need muscle-strengthening exercises on 2 or more days a week.  Maintain a healthy weight. The body mass index (BMI) is a screening tool to identify possible weight problems. It provides an estimate of body fat based on height and weight. Your health care provider can find your BMI and can help you achieve or maintain a healthy weight.For adults 20 years and older:  A BMI below 18.5 is considered underweight.  A BMI of 18.5 to 24.9 is normal.  A BMI of 25 to 29.9 is considered overweight.  A  BMI of 30 and above is considered obese.  Maintain normal blood lipids and cholesterol levels by exercising and minimizing your intake of saturated fat. Eat a balanced diet with plenty of fruit and vegetables. Blood tests for lipids and cholesterol should begin at age 45 and be repeated every 5 years. If your lipid or cholesterol levels are high, you are over 50, or you are at high risk for heart disease, you may need your cholesterol levels checked more frequently.Ongoing high lipid and cholesterol levels should be treated with medicines if diet and exercise are not working.  If you smoke, find out from your health care provider how to quit. If you do not use tobacco, do not start.  Lung cancer screening is recommended for adults aged 45-80 years who are at high risk for developing lung cancer because of a history of smoking. A yearly low-dose CT scan of the lungs is recommended for people who have at least a 30-pack-year history of smoking and are a current smoker or have quit within the past 15 years. A pack year of smoking is smoking an average of 1 pack of cigarettes a day for 1 year (for example: 1 pack a day for 30 years or 2 packs a day for 15 years). Yearly screening should continue until the smoker has stopped smoking for at least 15 years. Yearly screening should be stopped for people who develop a health problem that would prevent them from having lung cancer treatment.  If you are pregnant, do not drink alcohol. If you are  breastfeeding, be very cautious about drinking alcohol. If you are not pregnant and choose to drink alcohol, do not have more than 1 drink per day. One drink is considered to be 12 ounces (355 mL) of beer, 5 ounces (148 mL) of wine, or 1.5 ounces (44 mL) of liquor.  Avoid use of street drugs. Do not share needles with anyone. Ask for help if you need support or instructions about stopping the use of drugs.  High blood pressure causes heart disease and increases the risk  of stroke. Your blood pressure should be checked at least every 1 to 2 years. Ongoing high blood pressure should be treated with medicines if weight loss and exercise do not work.  If you are 55-79 years old, ask your health care provider if you should take aspirin to prevent strokes.  Diabetes screening is done by taking a blood sample to check your blood glucose level after you have not eaten for a certain period of time (fasting). If you are not overweight and you do not have risk factors for diabetes, you should be screened once every 3 years starting at age 45. If you are overweight or obese and you are 40-70 years of age, you should be screened for diabetes every year as part of your cardiovascular risk assessment.  Breast cancer screening is essential preventive care for women. You should practice "breast self-awareness." This means understanding the normal appearance and feel of your breasts and may include breast self-examination. Any changes detected, no matter how small, should be reported to a health care provider. Women in their 20s and 30s should have a clinical breast exam (CBE) by a health care provider as part of a regular health exam every 1 to 3 years. After age 40, women should have a CBE every year. Starting at age 40, women should consider having a mammogram (breast X-ray test) every year. Women who have a family history of breast cancer should talk to their health care provider about genetic screening. Women at a high risk of breast cancer should talk to their health care providers about having an MRI and a mammogram every year.  Breast cancer gene (BRCA)-related cancer risk assessment is recommended for women who have family members with BRCA-related cancers. BRCA-related cancers include breast, ovarian, tubal, and peritoneal cancers. Having family members with these cancers may be associated with an increased risk for harmful changes (mutations) in the breast cancer genes BRCA1 and  BRCA2. Results of the assessment will determine the need for genetic counseling and BRCA1 and BRCA2 testing.  Your health care provider may recommend that you be screened regularly for cancer of the pelvic organs (ovaries, uterus, and vagina). This screening involves a pelvic examination, including checking for microscopic changes to the surface of your cervix (Pap test). You may be encouraged to have this screening done every 3 years, beginning at age 21.  For women ages 30-65, health care providers may recommend pelvic exams and Pap testing every 3 years, or they may recommend the Pap and pelvic exam, combined with testing for human papilloma virus (HPV), every 5 years. Some types of HPV increase your risk of cervical cancer. Testing for HPV may also be done on women of any age with unclear Pap test results.  Other health care providers may not recommend any screening for nonpregnant women who are considered low risk for pelvic cancer and who do not have symptoms. Ask your health care provider if a screening pelvic exam is right for   you.  If you have had past treatment for cervical cancer or a condition that could lead to cancer, you need Pap tests and screening for cancer for at least 20 years after your treatment. If Pap tests have been discontinued, your risk factors (such as having a new sexual partner) need to be reassessed to determine if screening should resume. Some women have medical problems that increase the chance of getting cervical cancer. In these cases, your health care provider may recommend more frequent screening and Pap tests.  Colorectal cancer can be detected and often prevented. Most routine colorectal cancer screening begins at the age of 50 years and continues through age 75 years. However, your health care provider may recommend screening at an earlier age if you have risk factors for colon cancer. On a yearly basis, your health care provider may provide home test kits to check  for hidden blood in the stool. Use of a small camera at the end of a tube, to directly examine the colon (sigmoidoscopy or colonoscopy), can detect the earliest forms of colorectal cancer. Talk to your health care provider about this at age 50, when routine screening begins. Direct exam of the colon should be repeated every 5-10 years through age 75 years, unless early forms of precancerous polyps or small growths are found.  People who are at an increased risk for hepatitis B should be screened for this virus. You are considered at high risk for hepatitis B if:  You were born in a country where hepatitis B occurs often. Talk with your health care provider about which countries are considered high risk.  Your parents were born in a high-risk country and you have not received a shot to protect against hepatitis B (hepatitis B vaccine).  You have HIV or AIDS.  You use needles to inject street drugs.  You live with, or have sex with, someone who has hepatitis B.  You get hemodialysis treatment.  You take certain medicines for conditions like cancer, organ transplantation, and autoimmune conditions.  Hepatitis C blood testing is recommended for all people born from 1945 through 1965 and any individual with known risks for hepatitis C.  Practice safe sex. Use condoms and avoid high-risk sexual practices to reduce the spread of sexually transmitted infections (STIs). STIs include gonorrhea, chlamydia, syphilis, trichomonas, herpes, HPV, and human immunodeficiency virus (HIV). Herpes, HIV, and HPV are viral illnesses that have no cure. They can result in disability, cancer, and death.  You should be screened for sexually transmitted illnesses (STIs) including gonorrhea and chlamydia if:  You are sexually active and are younger than 24 years.  You are older than 24 years and your health care provider tells you that you are at risk for this type of infection.  Your sexual activity has changed  since you were last screened and you are at an increased risk for chlamydia or gonorrhea. Ask your health care provider if you are at risk.  If you are at risk of being infected with HIV, it is recommended that you take a prescription medicine daily to prevent HIV infection. This is called preexposure prophylaxis (PrEP). You are considered at risk if:  You are sexually active and do not regularly use condoms or know the HIV status of your partner(s).  You take drugs by injection.  You are sexually active with a partner who has HIV.  Talk with your health care provider about whether you are at high risk of being infected with HIV. If   you choose to begin PrEP, you should first be tested for HIV. You should then be tested every 3 months for as long as you are taking PrEP.  Osteoporosis is a disease in which the bones lose minerals and strength with aging. This can result in serious bone fractures or breaks. The risk of osteoporosis can be identified using a bone density scan. Women ages 67 years and over and women at risk for fractures or osteoporosis should discuss screening with their health care providers. Ask your health care provider whether you should take a calcium supplement or vitamin D to reduce the rate of osteoporosis.  Menopause can be associated with physical symptoms and risks. Hormone replacement therapy is available to decrease symptoms and risks. You should talk to your health care provider about whether hormone replacement therapy is right for you.  Use sunscreen. Apply sunscreen liberally and repeatedly throughout the day. You should seek shade when your shadow is shorter than you. Protect yourself by wearing long sleeves, pants, a wide-brimmed hat, and sunglasses year round, whenever you are outdoors.  Once a month, do a whole body skin exam, using a mirror to look at the skin on your back. Tell your health care provider of new moles, moles that have irregular borders, moles that  are larger than a pencil eraser, or moles that have changed in shape or color.  Stay current with required vaccines (immunizations).  Influenza vaccine. All adults should be immunized every year.  Tetanus, diphtheria, and acellular pertussis (Td, Tdap) vaccine. Pregnant women should receive 1 dose of Tdap vaccine during each pregnancy. The dose should be obtained regardless of the length of time since the last dose. Immunization is preferred during the 27th-36th week of gestation. An adult who has not previously received Tdap or who does not know her vaccine status should receive 1 dose of Tdap. This initial dose should be followed by tetanus and diphtheria toxoids (Td) booster doses every 10 years. Adults with an unknown or incomplete history of completing a 3-dose immunization series with Td-containing vaccines should begin or complete a primary immunization series including a Tdap dose. Adults should receive a Td booster every 10 years.  Varicella vaccine. An adult without evidence of immunity to varicella should receive 2 doses or a second dose if she has previously received 1 dose. Pregnant females who do not have evidence of immunity should receive the first dose after pregnancy. This first dose should be obtained before leaving the health care facility. The second dose should be obtained 4-8 weeks after the first dose.  Human papillomavirus (HPV) vaccine. Females aged 13-26 years who have not received the vaccine previously should obtain the 3-dose series. The vaccine is not recommended for use in pregnant females. However, pregnancy testing is not needed before receiving a dose. If a female is found to be pregnant after receiving a dose, no treatment is needed. In that case, the remaining doses should be delayed until after the pregnancy. Immunization is recommended for any person with an immunocompromised condition through the age of 61 years if she did not get any or all doses earlier. During the  3-dose series, the second dose should be obtained 4-8 weeks after the first dose. The third dose should be obtained 24 weeks after the first dose and 16 weeks after the second dose.  Zoster vaccine. One dose is recommended for adults aged 30 years or older unless certain conditions are present.  Measles, mumps, and rubella (MMR) vaccine. Adults born  before 1957 generally are considered immune to measles and mumps. Adults born in 1957 or later should have 1 or more doses of MMR vaccine unless there is a contraindication to the vaccine or there is laboratory evidence of immunity to each of the three diseases. A routine second dose of MMR vaccine should be obtained at least 28 days after the first dose for students attending postsecondary schools, health care workers, or international travelers. People who received inactivated measles vaccine or an unknown type of measles vaccine during 1963-1967 should receive 2 doses of MMR vaccine. People who received inactivated mumps vaccine or an unknown type of mumps vaccine before 1979 and are at high risk for mumps infection should consider immunization with 2 doses of MMR vaccine. For females of childbearing age, rubella immunity should be determined. If there is no evidence of immunity, females who are not pregnant should be vaccinated. If there is no evidence of immunity, females who are pregnant should delay immunization until after pregnancy. Unvaccinated health care workers born before 1957 who lack laboratory evidence of measles, mumps, or rubella immunity or laboratory confirmation of disease should consider measles and mumps immunization with 2 doses of MMR vaccine or rubella immunization with 1 dose of MMR vaccine.  Pneumococcal 13-valent conjugate (PCV13) vaccine. When indicated, a person who is uncertain of his immunization history and has no record of immunization should receive the PCV13 vaccine. All adults 65 years of age and older should receive this  vaccine. An adult aged 19 years or older who has certain medical conditions and has not been previously immunized should receive 1 dose of PCV13 vaccine. This PCV13 should be followed with a dose of pneumococcal polysaccharide (PPSV23) vaccine. Adults who are at high risk for pneumococcal disease should obtain the PPSV23 vaccine at least 8 weeks after the dose of PCV13 vaccine. Adults older than 51 years of age who have normal immune system function should obtain the PPSV23 vaccine dose at least 1 year after the dose of PCV13 vaccine.  Pneumococcal polysaccharide (PPSV23) vaccine. When PCV13 is also indicated, PCV13 should be obtained first. All adults aged 65 years and older should be immunized. An adult younger than age 65 years who has certain medical conditions should be immunized. Any person who resides in a nursing home or long-term care facility should be immunized. An adult smoker should be immunized. People with an immunocompromised condition and certain other conditions should receive both PCV13 and PPSV23 vaccines. People with human immunodeficiency virus (HIV) infection should be immunized as soon as possible after diagnosis. Immunization during chemotherapy or radiation therapy should be avoided. Routine use of PPSV23 vaccine is not recommended for American Indians, [place] Natives, or people younger than 65 years unless there are medical conditions that require PPSV23 vaccine. When indicated, people who have unknown immunization and have no record of immunization should receive PPSV23 vaccine. One-time revaccination 5 years after the first dose of PPSV23 is recommended for people aged 19-64 years who have chronic kidney failure, nephrotic syndrome, asplenia, or immunocompromised conditions. People who received 1-2 doses of PPSV23 before age 65 years should receive another dose of PPSV23 vaccine at age 65 years or later if at least 5 years have passed since the previous dose. Doses of PPSV23 are not  needed for people immunized with PPSV23 at or after age 65 years.  Meningococcal vaccine. Adults with asplenia or persistent complement component deficiencies should receive 2 doses of quadrivalent meningococcal conjugate (MenACWY-D) vaccine. The doses should be obtained   at least 2 months apart. Microbiologists working with certain meningococcal bacteria, [place] recruits, people at risk during an outbreak, and people who travel to or live in countries with a high rate of meningitis should be immunized. A first-year college student up through age 34 years who is living in a residence hall should receive a dose if she did not receive a dose on or after her 16th birthday. Adults who have certain high-risk conditions should receive one or more doses of vaccine.  Hepatitis A vaccine. Adults who wish to be protected from this disease, have certain high-risk conditions, work with hepatitis A-infected animals, work in hepatitis A research labs, or travel to or work in countries with a high rate of hepatitis A should be immunized. Adults who were previously unvaccinated and who anticipate close contact with an international adoptee during the first 60 days after arrival in the [place] from a country with a high rate of hepatitis A should be immunized.  Hepatitis B vaccine. Adults who wish to be protected from this disease, have certain high-risk conditions, may be exposed to blood or other infectious body fluids, are household contacts or sex partners of hepatitis B positive people, are clients or workers in certain care facilities, or travel to or work in countries with a high rate of hepatitis B should be immunized.  Haemophilus influenzae type b (Hib) vaccine. A previously unvaccinated person with asplenia or sickle cell disease or having a scheduled splenectomy should receive 1 dose of Hib vaccine. Regardless of previous immunization, a recipient of a hematopoietic stem cell transplant should receive a  3-dose series 6-12 months after her successful transplant. Hib vaccine is not recommended for adults with HIV infection. Preventive Services / Frequency Ages 35 to 4 years  Blood pressure check.** / Every 3-5 years.  Lipid and cholesterol check.** / Every 5 years beginning at age 60.  Clinical breast exam.** / Every 3 years for women in their 71s and 10s.  BRCA-related cancer risk assessment.** / For women who have family members with a BRCA-related cancer (breast, ovarian, tubal, or peritoneal cancers).  Pap test.** / Every 2 years from ages 76 through 26. Every 3 years starting at age 61 through age 76 or 93 with a history of 3 consecutive normal Pap tests.  HPV screening.** / Every 3 years from ages 37 through ages 60 to 51 with a history of 3 consecutive normal Pap tests.  Hepatitis C blood test.** / For any individual with known risks for hepatitis C.  Skin self-exam. / Monthly.  Influenza vaccine. / Every year.  Tetanus, diphtheria, and acellular pertussis (Tdap, Td) vaccine.** / Consult your health care provider. Pregnant women should receive 1 dose of Tdap vaccine during each pregnancy. 1 dose of Td every 10 years.  Varicella vaccine.** / Consult your health care provider. Pregnant females who do not have evidence of immunity should receive the first dose after pregnancy.  HPV vaccine. / 3 doses over 6 months, if 93 and younger. The vaccine is not recommended for use in pregnant females. However, pregnancy testing is not needed before receiving a dose.  Measles, mumps, rubella (MMR) vaccine.** / You need at least 1 dose of MMR if you were born in 1957 or later. You may also need a 2nd dose. For females of childbearing age, rubella immunity should be determined. If there is no evidence of immunity, females who are not pregnant should be vaccinated. If there is no evidence of immunity, females who are  pregnant should delay immunization until after pregnancy.  Pneumococcal  13-valent conjugate (PCV13) vaccine.** / Consult your health care provider.  Pneumococcal polysaccharide (PPSV23) vaccine.** / 1 to 2 doses if you smoke cigarettes or if you have certain conditions.  Meningococcal vaccine.** / 1 dose if you are age 68 to 8 years and a Market researcher living in a residence hall, or have one of several medical conditions, you need to get vaccinated against meningococcal disease. You may also need additional booster doses.  Hepatitis A vaccine.** / Consult your health care provider.  Hepatitis B vaccine.** / Consult your health care provider.  Haemophilus influenzae type b (Hib) vaccine.** / Consult your health care provider. Ages 7 to 53 years  Blood pressure check.** / Every year.  Lipid and cholesterol check.** / Every 5 years beginning at age 25 years.  Lung cancer screening. / Every year if you are aged 11-80 years and have a 30-pack-year history of smoking and currently smoke or have quit within the past 15 years. Yearly screening is stopped once you have quit smoking for at least 15 years or develop a health problem that would prevent you from having lung cancer treatment.  Clinical breast exam.** / Every year after age 48 years.  BRCA-related cancer risk assessment.** / For women who have family members with a BRCA-related cancer (breast, ovarian, tubal, or peritoneal cancers).  Mammogram.** / Every year beginning at age 41 years and continuing for as long as you are in good health. Consult with your health care provider.  Pap test.** / Every 3 years starting at age 65 years through age 37 or 70 years with a history of 3 consecutive normal Pap tests.  HPV screening.** / Every 3 years from ages 72 years through ages 60 to 40 years with a history of 3 consecutive normal Pap tests.  Fecal occult blood test (FOBT) of stool. / Every year beginning at age 21 years and continuing until age 5 years. You may not need to do this test if you get  a colonoscopy every 10 years.  Flexible sigmoidoscopy or colonoscopy.** / Every 5 years for a flexible sigmoidoscopy or every 10 years for a colonoscopy beginning at age 35 years and continuing until age 48 years.  Hepatitis C blood test.** / For all people born from [birth year] through 1965 and any individual with known risks for hepatitis C.  Skin self-exam. / Monthly.  Influenza vaccine. / Every year.  Tetanus, diphtheria, and acellular pertussis (Tdap/Td) vaccine.** / Consult your health care provider. Pregnant women should receive 1 dose of Tdap vaccine during each pregnancy. 1 dose of Td every 10 years.  Varicella vaccine.** / Consult your health care provider. Pregnant females who do not have evidence of immunity should receive the first dose after pregnancy.  Zoster vaccine.** / 1 dose for adults aged 30 years or older.  Measles, mumps, rubella (MMR) vaccine.** / You need at least 1 dose of MMR if you were born in 1957 or later. You may also need a second dose. For females of childbearing age, rubella immunity should be determined. If there is no evidence of immunity, females who are not pregnant should be vaccinated. If there is no evidence of immunity, females who are pregnant should delay immunization until after pregnancy.  Pneumococcal 13-valent conjugate (PCV13) vaccine.** / Consult your health care provider.  Pneumococcal polysaccharide (PPSV23) vaccine.** / 1 to 2 doses if you smoke cigarettes or if you have certain conditions.  Meningococcal vaccine.** /  Consult your health care provider.  Hepatitis A vaccine.** / Consult your health care provider.  Hepatitis B vaccine.** / Consult your health care provider.  Haemophilus influenzae type b (Hib) vaccine.** / Consult your health care provider. Ages 64 years and over  Blood pressure check.** / Every year.  Lipid and cholesterol check.** / Every 5 years beginning at age 23 years.  Lung cancer screening. / Every year if you  are aged 16-80 years and have a 30-pack-year history of smoking and currently smoke or have quit within the past 15 years. Yearly screening is stopped once you have quit smoking for at least 15 years or develop a health problem that would prevent you from having lung cancer treatment.  Clinical breast exam.** / Every year after age 74 years.  BRCA-related cancer risk assessment.** / For women who have family members with a BRCA-related cancer (breast, ovarian, tubal, or peritoneal cancers).  Mammogram.** / Every year beginning at age 44 years and continuing for as long as you are in good health. Consult with your health care provider.  Pap test.** / Every 3 years starting at age 58 years through age 22 or 39 years with 3 consecutive normal Pap tests. Testing can be stopped between 65 and 70 years with 3 consecutive normal Pap tests and no abnormal Pap or HPV tests in the past 10 years.  HPV screening.** / Every 3 years from ages 64 years through ages 70 or 61 years with a history of 3 consecutive normal Pap tests. Testing can be stopped between 65 and 70 years with 3 consecutive normal Pap tests and no abnormal Pap or HPV tests in the past 10 years.  Fecal occult blood test (FOBT) of stool. / Every year beginning at age 40 years and continuing until age 27 years. You may not need to do this test if you get a colonoscopy every 10 years.  Flexible sigmoidoscopy or colonoscopy.** / Every 5 years for a flexible sigmoidoscopy or every 10 years for a colonoscopy beginning at age 7 years and continuing until age 32 years.  Hepatitis C blood test.** / For all people born from [birth year] through 1967 and any individual with known risks for hepatitis C.  Osteoporosis screening.** / A one-time screening for women ages 30 years and over and women at risk for fractures or osteoporosis.  Skin self-exam. / Monthly.  Influenza vaccine. / Every year.  Tetanus, diphtheria, and acellular pertussis (Tdap/Td)  vaccine.** / 1 dose of Td every 10 years.  Varicella vaccine.** / Consult your health care provider.  Zoster vaccine.** / 1 dose for adults aged 35 years or older.  Pneumococcal 13-valent conjugate (PCV13) vaccine.** / Consult your health care provider.  Pneumococcal polysaccharide (PPSV23) vaccine.** / 1 dose for all adults aged 46 years and older.  Meningococcal vaccine.** / Consult your health care provider.  Hepatitis A vaccine.** / Consult your health care provider.  Hepatitis B vaccine.** / Consult your health care provider.  Haemophilus influenzae type b (Hib) vaccine.** / Consult your health care provider. ** Family history and personal history of risk and conditions may change your health care provider's recommendations.   This information is not intended to replace advice given to you by your health care provider. Make sure you discuss any questions you have with your health care provider.   Document Released: 05/25/2001 Document Revised: 04/19/2014 Document Reviewed: 08/24/2010 Elsevier Interactive Patient Education [employer].

## 2015-04-10 NOTE — Progress Notes (Signed)
Pre visit review using our clinic review tool, if applicable. No additional management support is needed unless otherwise documented below in the visit note. 

## 2015-04-10 NOTE — Progress Notes (Signed)
Subjective:     [name] is a 49 y.o. female and is here for a comprehensive physical exam. The patient reports no problems.  Social History   Social History  . Marital Status: Married    Spouse Name: N/A  . Number of Children: N/A  . Years of Education: N/A   Occupational History  . Not on file.   Social History Main Topics  . Smoking status: Never Smoker   . Smokeless tobacco: Never Used  . Alcohol Use: Yes     Comment: Moderate  . Drug Use: No  . Sexual Activity: Yes   Other Topics Concern  . Not on file   Social History Narrative   Health Maintenance  Topic Date Due  . Hepatitis C Screening  [birth year]  . HIV Screening  05/14/1978  . INFLUENZA VACCINE  04/09/2016 (Originally 11/11/2014)  . MAMMOGRAM  01/17/2016  . PAP SMEAR  10/28/2017  . TETANUS/TDAP  05/01/2020  . COLONOSCOPY  03/01/2024    The following portions of the patient's history were reviewed and updated as appropriate:  She  has a past medical history of Hypothyroid; Arthritis; and Family history of anesthesia complication. She  does not have any pertinent problems on file. She  has past surgical history that includes No past surgeries and Total hip arthroplasty (Right, 06/29/2013). Her family history includes Diabetes in her father and paternal grandfather; Heart disease in her father; Hypertension in her mother; Throat cancer in her father. She  reports that she has never smoked. She has never used smokeless tobacco. She reports that she drinks alcohol. She reports that she does not use illicit drugs. She has a current medication list which includes the following prescription(s): levothyroxine and norethindrone-ethinyl estradiol. Current Outpatient Prescriptions on File Prior to Visit  Medication Sig Dispense Refill  . norethindrone-ethinyl estradiol (LOESTRIN FE 1/20) 1-20 MG-MCG tablet Take 1 tablet by mouth daily.     No current facility-administered medications on file prior to visit.    She has No Known Allergies..  Review of Systems Review of Systems  Constitutional: Negative for activity change, appetite change and fatigue.  HENT: Negative for hearing loss, congestion, tinnitus and ear discharge.  dentist q1y Eyes: Negative for visual disturbance (see optho q1y -- vision corrected to 20/20 with glasses).  Respiratory: Negative for cough, chest tightness and shortness of breath.   Cardiovascular: Negative for chest pain, palpitations and leg swelling.  Gastrointestinal: Negative for abdominal pain, diarrhea, constipation and abdominal distention.  Genitourinary: Negative for urgency, frequency, decreased urine volume and difficulty urinating.  Musculoskeletal: Negative for back pain, arthralgias and gait problem.  Skin: Negative for color change, pallor and rash.  Neurological: Negative for dizziness, light-headedness, numbness and headaches.  Hematological: Negative for adenopathy. Does not bruise/bleed easily.  Psychiatric/Behavioral: Negative for suicidal ideas, confusion, sleep disturbance, self-injury, dysphoric mood, decreased concentration and agitation.       Objective:    BP 110/78 mmHg  Pulse 70  Temp(Src) 98.1 F (36.7 C) (Oral)  Ht  (1.651 m)  Wt 187 lb 12.8 oz (85.186 kg)  BMI 31.25 kg/m2  SpO2 98% General appearance: alert, cooperative, appears stated age and no distress Head: Normocephalic, without obvious abnormality, atraumatic Eyes: conjunctivae/corneas clear. PERRL, EOM's intact. Fundi benign. Ears: normal TM's and external ear canals both ears Nose: Nares normal. Septum midline. Mucosa normal. No drainage or sinus tenderness. Throat: lips, mucosa, and tongue normal; teeth and gums normal Neck: no adenopathy, no carotid bruit, no JVD, supple,  symmetrical, trachea midline and thyroid not enlarged, symmetric, no tenderness/mass/nodules Back: symmetric, no curvature. ROM normal. No CVA tenderness. Lungs: clear to auscultation  bilaterally Breasts: gyn Heart: regular rate and rhythm, S1, S2 normal, no murmur, click, rub or gallop Abdomen: soft, non-tender; bowel sounds normal; no masses,  no organomegaly Pelvic: deferred-gyn Extremities: extremities normal, atraumatic, no cyanosis or edema Pulses: 2+ and symmetric Skin: Skin color, texture, turgor normal. No rashes or lesions Lymph nodes: Cervical, supraclavicular, and axillary nodes normal. Neurologic: Alert and oriented X 3, normal strength and tone. Normal symmetric reflexes. Normal coordination and gait Psych- no depression, no anxiety      Assessment:    Healthy female exam.      Plan:    ghm utd Check labs See After Visit Summary for Counseling Recommendations    1. Hypothyroidism, unspecified hypothyroidism type   - levothyroxine (SYNTHROID, LEVOTHROID) 50 MCG tablet; Take 1 tablet (50 mcg total) by mouth daily before breakfast.  Dispense: 90 tablet; Refill: 3  2. Need for hepatitis C screening test   - Hepatitis C antibody  3. Encounter for screening for HIV    - HIV antibody

## 2015-04-11 LAB — HEPATITIS C ANTIBODY: HCV Ab: NEGATIVE

## 2015-04-11 LAB — HIV ANTIBODY (ROUTINE TESTING W REFLEX): HIV 1&2 Ab, 4th Generation: NONREACTIVE

## 2015-04-17 ENCOUNTER — Other Ambulatory Visit: Payer: Self-pay

## 2015-04-17 DIAGNOSIS — E039 Hypothyroidism, unspecified: Secondary | ICD-10-CM

## 2015-04-17 MED ORDER — LEVOTHYROXINE SODIUM 50 MCG PO TABS
50.0000 ug | ORAL_TABLET | Freq: Every day | ORAL | Status: DC
Start: 2015-04-17 — End: 2015-05-03

## 2015-04-21 ENCOUNTER — Other Ambulatory Visit: Payer: Self-pay | Admitting: Family Medicine

## 2015-04-21 ENCOUNTER — Encounter: Payer: Self-pay | Admitting: Family Medicine

## 2015-04-21 DIAGNOSIS — E039 Hypothyroidism, unspecified: Secondary | ICD-10-CM

## 2015-04-21 DIAGNOSIS — E785 Hyperlipidemia, unspecified: Secondary | ICD-10-CM

## 2015-04-28 ENCOUNTER — Other Ambulatory Visit (INDEPENDENT_AMBULATORY_CARE_PROVIDER_SITE_OTHER): Payer: BC Managed Care – PPO

## 2015-04-28 DIAGNOSIS — E785 Hyperlipidemia, unspecified: Secondary | ICD-10-CM | POA: Diagnosis not present

## 2015-04-28 DIAGNOSIS — E039 Hypothyroidism, unspecified: Secondary | ICD-10-CM

## 2015-04-28 LAB — COMPREHENSIVE METABOLIC PANEL
ALBUMIN: 4 g/dL (ref 3.5–5.2)
ALT: 23 U/L (ref 0–35)
AST: 19 U/L (ref 0–37)
Alkaline Phosphatase: 53 U/L (ref 39–117)
BUN: 19 mg/dL (ref 6–23)
CO2: 24 mEq/L (ref 19–32)
Calcium: 8.9 mg/dL (ref 8.4–10.5)
Chloride: 105 mEq/L (ref 96–112)
Creatinine, Ser: 0.8 mg/dL (ref 0.40–1.20)
GFR: 80.07 mL/min (ref >60.00)
Glucose, Bld: 103 mg/dL — ABNORMAL HIGH (ref 70–99)
Potassium: 4.4 mEq/L (ref 3.5–5.1)
Sodium: 139 mEq/L (ref 135–145)
Total Bilirubin: 0.5 mg/dL (ref 0.2–1.2)
Total Protein: 7 g/dL (ref 6.0–8.3)

## 2015-04-28 LAB — LIPID PANEL
CHOLESTEROL: 200 mg/dL (ref 0–200)
HDL: 55.9 mg/dL (ref >39.00)
LDL Cholesterol: 119 mg/dL — ABNORMAL HIGH (ref 0–99)
NONHDL: 143.76
Total CHOL/HDL Ratio: 4
Triglycerides: 126 mg/dL (ref 0.0–149.0)
VLDL: 25.2 mg/dL (ref 0.0–40.0)

## 2015-04-28 LAB — TSH: TSH: 6.96 u[IU]/mL — AB (ref 0.35–4.50)

## 2015-05-03 ENCOUNTER — Encounter: Payer: Self-pay | Admitting: Family Medicine

## 2015-05-03 ENCOUNTER — Other Ambulatory Visit: Payer: Self-pay | Admitting: Family Medicine

## 2015-05-03 DIAGNOSIS — E039 Hypothyroidism, unspecified: Secondary | ICD-10-CM

## 2015-05-03 MED ORDER — LEVOTHYROXINE SODIUM 75 MCG PO TABS: 75.0000 ug | ORAL_TABLET | Freq: Every day | ORAL | Status: DC

## 2015-05-05 NOTE — Telephone Encounter (Signed)
Message handled in another encounter.  KP

## 2015-07-01 ENCOUNTER — Other Ambulatory Visit: Payer: Self-pay | Admitting: Family Medicine

## 2015-07-01 DIAGNOSIS — E039 Hypothyroidism, unspecified: Secondary | ICD-10-CM

## 2015-07-01 NOTE — Telephone Encounter (Signed)
Please schedule the patient a lab to check her TSH. Order is in.   [place]

## 2015-07-02 NOTE — Telephone Encounter (Signed)
Patient scheduled for 07/07/2015 for labs only

## 2015-07-07 ENCOUNTER — Other Ambulatory Visit (INDEPENDENT_AMBULATORY_CARE_PROVIDER_SITE_OTHER): Payer: BC Managed Care – PPO

## 2015-07-07 DIAGNOSIS — E039 Hypothyroidism, unspecified: Secondary | ICD-10-CM | POA: Diagnosis not present

## 2015-07-07 LAB — TSH: TSH: 2.17 u[IU]/mL (ref 0.35–4.50)

## 2015-12-26 ENCOUNTER — Other Ambulatory Visit: Payer: Self-pay | Admitting: Nurse Practitioner

## 2015-12-26 DIAGNOSIS — Z1231 Encounter for screening mammogram for malignant neoplasm of breast: Secondary | ICD-10-CM

## 2016-01-21 ENCOUNTER — Ambulatory Visit
Admission: RE | Admit: 2016-01-21 | Discharge: 2016-01-21 | Disposition: A | Discharge status: Home / Self Care | Payer: BC Managed Care – PPO | Source: Ambulatory Visit | Attending: Nurse Practitioner | Admitting: Nurse Practitioner

## 2016-01-21 DIAGNOSIS — Z1231 Encounter for screening mammogram for malignant neoplasm of breast: Secondary | ICD-10-CM

## 2016-04-30 ENCOUNTER — Encounter: Payer: Self-pay | Admitting: Family Medicine

## 2016-04-30 ENCOUNTER — Other Ambulatory Visit: Payer: Self-pay | Admitting: Family Medicine

## 2016-04-30 MED ORDER — LEVOTHYROXINE SODIUM 75 MCG PO TABS: 75.0000 ug | ORAL_TABLET | Freq: Every day | ORAL | 1 refills | Status: DC

## 2016-05-20 ENCOUNTER — Ambulatory Visit (INDEPENDENT_AMBULATORY_CARE_PROVIDER_SITE_OTHER): Payer: BC Managed Care – PPO | Admitting: Family Medicine

## 2016-05-20 ENCOUNTER — Encounter: Payer: Self-pay | Admitting: Family Medicine

## 2016-05-20 VITALS — BP 128/68 | HR 68 | Temp 98.2°F | Resp 16 | Ht 65.0 in | Wt 187.8 lb

## 2016-05-20 DIAGNOSIS — E039 Hypothyroidism, unspecified: Secondary | ICD-10-CM

## 2016-05-20 DIAGNOSIS — Z0000 Encounter for general adult medical examination without abnormal findings: Secondary | ICD-10-CM | POA: Diagnosis not present

## 2016-05-20 LAB — COMPREHENSIVE METABOLIC PANEL
ALT: 15 U/L (ref 0–35)
AST: 17 U/L (ref 0–37)
Albumin: 4.1 g/dL (ref 3.5–5.2)
Alkaline Phosphatase: 47 U/L (ref 39–117)
BILIRUBIN TOTAL: 0.5 mg/dL (ref 0.2–1.2)
BUN: 15 mg/dL (ref 6–23)
CALCIUM: 9.1 mg/dL (ref 8.4–10.5)
CHLORIDE: 105 meq/L (ref 96–112)
CO2: 29 meq/L (ref 19–32)
Creatinine, Ser: 0.74 mg/dL (ref 0.40–1.20)
GFR: 87.25 mL/min (ref >60.00)
Glucose, Bld: 98 mg/dL (ref 70–99)
POTASSIUM: 4.1 meq/L (ref 3.5–5.1)
Sodium: 139 mEq/L (ref 135–145)
Total Protein: 7.2 g/dL (ref 6.0–8.3)

## 2016-05-20 LAB — CBC WITH DIFFERENTIAL/PLATELET
BASOS PCT: 0.8 % (ref 0.0–3.0)
Basophils Absolute: 0.1 10*3/uL (ref 0.0–0.1)
Eosinophils Absolute: 0.1 10*3/uL (ref 0.0–0.7)
Eosinophils Relative: 1.5 % (ref 0.0–5.0)
HCT: 41.8 % (ref 36.0–46.0)
Hemoglobin: 14 g/dL (ref 12.0–15.0)
LYMPHS ABS: 2 10*3/uL (ref 0.7–4.0)
LYMPHS PCT: 24.9 % (ref 12.0–46.0)
MCHC: 33.4 g/dL (ref 30.0–36.0)
MCV: 93.2 fl (ref 78.0–100.0)
MONOS PCT: 5.8 % (ref 3.0–12.0)
Monocytes Absolute: 0.5 10*3/uL (ref 0.1–1.0)
NEUTROS ABS: 5.5 10*3/uL (ref 1.4–7.7)
Neutrophils Relative %: 67 % (ref 43.0–77.0)
PLATELETS: 247 10*3/uL (ref 150.0–400.0)
RBC: 4.48 Mil/uL (ref 3.87–5.11)
RDW: 13.1 % (ref 11.5–15.5)
WBC: 8.2 10*3/uL (ref 4.0–10.5)

## 2016-05-20 LAB — LIPID PANEL
CHOL/HDL RATIO: 3
Cholesterol: 204 mg/dL — ABNORMAL HIGH (ref 0–200)
HDL: 60.6 mg/dL (ref >39.00)
LDL CALC: 117 mg/dL — AB (ref 0–99)
NonHDL: 143.7
TRIGLYCERIDES: 135 mg/dL (ref 0.0–149.0)
VLDL: 27 mg/dL (ref 0.0–40.0)

## 2016-05-20 LAB — POC URINALSYSI DIPSTICK (AUTOMATED)
Bilirubin, UA: NEGATIVE
Blood, UA: NEGATIVE
Glucose, UA: NEGATIVE
KETONES UA: NEGATIVE
LEUKOCYTES UA: NEGATIVE
Nitrite, UA: NEGATIVE
PH UA: 6
PROTEIN UA: NEGATIVE
Urobilinogen, UA: 0.2

## 2016-05-20 LAB — TSH: TSH: 5.49 u[IU]/mL — ABNORMAL HIGH (ref 0.35–4.50)

## 2016-05-20 NOTE — Progress Notes (Signed)
Pre visit review using our clinic review tool, if applicable. No additional management support is needed unless otherwise documented below in the visit note. Subjective:   I acted as a Neurosurgeonscribe for Dr. Zola ButtonLowne-Chase.  [name], CMA   [name] is a 51 y.o. female and is here for a comprehensive physical exam. The patient reports no problems.  Social History   Social History  . Marital status: Married    Spouse name: N/A  . Number of children: N/A  . Years of education: N/A   Occupational History  . Not on file.   Social History Main Topics  . Smoking status: Never Smoker  . Smokeless tobacco: Never Used  . Alcohol use Yes     Comment: Moderate  . Drug use: No  . Sexual activity: Yes   Other Topics Concern  . Not on file   Social History Narrative   Just started going to gym   Lives with husband and 2 kids      Health Maintenance  Topic Date Due  . INFLUENZA VACCINE  07/11/2026 (Originally 11/11/2015)  . MAMMOGRAM  01/20/2017  . PAP SMEAR  10/28/2017  . TETANUS/TDAP  05/01/2020  . COLONOSCOPY  03/01/2024  . Hepatitis C Screening  Completed  . HIV Screening  Completed    The following portions of the patient's history were reviewed and updated as appropriate:  She  has a past medical history of Arthritis; Family history of anesthesia complication; and Hypothyroid. She  does not have any pertinent problems on file. She  has a past surgical history that includes No past surgeries and Total hip arthroplasty (Right, 06/29/2013). Her family history includes Diabetes in her father and paternal grandfather; Heart disease in her father; Hypertension in her mother; Throat cancer in her father. She  reports that she has never smoked. She has never used smokeless tobacco. She reports that she drinks alcohol. She reports that she does not use drugs. She has a current medication list which includes the following prescription(s): levothyroxine and norethindrone-ethinyl  estradiol. Current Outpatient Prescriptions on File Prior to Visit  Medication Sig Dispense Refill  . levothyroxine (SYNTHROID, LEVOTHROID) 75 MCG tablet Take 1 tablet (75 mcg total) by mouth daily. 30 tablet 1  . norethindrone-ethinyl estradiol (LOESTRIN FE 1/20) 1-20 MG-MCG tablet Take 1 tablet by mouth daily.     No current facility-administered medications on file prior to visit.    She has No Known Allergies..  Review of Systems Review of Systems  Constitutional: Negative for activity change, appetite change and fatigue.  HENT: Negative for hearing loss, congestion, tinnitus and ear discharge.  dentist [id number] Eyes: Negative for visual disturbance (see optho q1y -- vision corrected to 20/20 with glasses).  Respiratory: Negative for cough, chest tightness and shortness of breath.   Cardiovascular: Negative for chest pain, palpitations and leg swelling.  Gastrointestinal: Negative for abdominal pain, diarrhea, constipation and abdominal distention.  Genitourinary: Negative for urgency, frequency, decreased urine volume and difficulty urinating.  Musculoskeletal: Negative for back pain, arthralgias and gait problem.  Skin: Negative for color change, pallor and rash.  Neurological: Negative for dizziness, light-headedness, numbness and headaches.  Hematological: Negative for adenopathy. Does not bruise/bleed easily.  Psychiatric/Behavioral: Negative for suicidal ideas, confusion, sleep disturbance, self-injury, dysphoric mood, decreased concentration and agitation.       Objective:    BP 128/68 (BP Location: Left Arm, Cuff Size: Normal)   Pulse 68   Temp 98.2 F (36.8 C) (Oral)   Resp 16  Ht 5\' 5"  (1.651 m)   Wt 187 lb 12.8 oz (85.2 kg)   LMP 05/05/2016   SpO2 98%   BMI 31.25 kg/m  General appearance: alert, cooperative, appears stated age and no distress Head: Normocephalic, without obvious abnormality, atraumatic Eyes: conjunctivae/corneas clear. PERRL, EOM's intact. Fundi  benign. Ears: normal TM's and external ear canals both ears Nose: Nares normal. Septum midline. Mucosa normal. No drainage or sinus tenderness. Throat: lips, mucosa, and tongue normal; teeth and gums normal Neck: no adenopathy, no carotid bruit, no JVD, supple, symmetrical, trachea midline and thyroid not enlarged, symmetric, no tenderness/mass/nodules Back: symmetric, no curvature. ROM normal. No CVA tenderness. Lungs: clear to auscultation bilaterally Breasts: gyn Heart: regular rate and rhythm, S1, S2 normal, no murmur, click, rub or gallop Abdomen: soft, non-tender; bowel sounds normal; no masses,  no organomegaly Pelvic: deferred --gyn Extremities: extremities normal, atraumatic, no cyanosis or edema Pulses: 2+ and symmetric Skin: Skin color, texture, turgor normal. No rashes or lesions Lymph nodes: Cervical, supraclavicular, and axillary nodes normal. Neurologic: Alert and oriented X 3, normal strength and tone. Normal symmetric reflexes. Normal coordination and gait    Assessment:    Healthy female exam.      Plan:     ghm utd Check labs See avs See After Visit Summary for Counseling Recommendations    1. Preventative health care See above - CBC with Differential/Platelet - Comprehensive metabolic panel - Lipid panel - POCT Urinalysis Dipstick (Automated)  2. Hypothyroidism, unspecified type Check labs - TSH

## 2016-05-20 NOTE — Patient Instructions (Signed)

## 2016-05-21 ENCOUNTER — Encounter: Payer: Self-pay | Admitting: Family Medicine

## 2016-05-21 ENCOUNTER — Other Ambulatory Visit: Payer: Self-pay | Admitting: Family Medicine

## 2016-05-21 DIAGNOSIS — E039 Hypothyroidism, unspecified: Secondary | ICD-10-CM

## 2016-05-21 MED ORDER — LEVOTHYROXINE SODIUM 100 MCG PO TABS: 100.0000 ug | ORAL_TABLET | Freq: Every day | ORAL | 2 refills | Status: DC

## 2016-05-24 ENCOUNTER — Other Ambulatory Visit: Payer: Self-pay | Admitting: Family Medicine

## 2016-05-24 ENCOUNTER — Encounter: Payer: Self-pay | Admitting: Family Medicine

## 2016-05-24 DIAGNOSIS — E785 Hyperlipidemia, unspecified: Secondary | ICD-10-CM

## 2016-05-24 DIAGNOSIS — E039 Hypothyroidism, unspecified: Secondary | ICD-10-CM

## 2016-05-25 ENCOUNTER — Other Ambulatory Visit: Payer: Self-pay | Admitting: Family Medicine

## 2016-05-25 DIAGNOSIS — E039 Hypothyroidism, unspecified: Secondary | ICD-10-CM

## 2016-05-25 MED ORDER — LEVOTHYROXINE SODIUM 100 MCG PO TABS: 100.0000 ug | ORAL_TABLET | Freq: Every day | ORAL | 1 refills | Status: DC

## 2016-07-28 ENCOUNTER — Other Ambulatory Visit (INDEPENDENT_AMBULATORY_CARE_PROVIDER_SITE_OTHER): Payer: BC Managed Care – PPO

## 2016-07-28 DIAGNOSIS — E039 Hypothyroidism, unspecified: Secondary | ICD-10-CM | POA: Diagnosis not present

## 2016-07-28 DIAGNOSIS — E785 Hyperlipidemia, unspecified: Secondary | ICD-10-CM | POA: Diagnosis not present

## 2016-07-28 LAB — COMPREHENSIVE METABOLIC PANEL
ALT: 16 U/L (ref 0–35)
AST: 19 U/L (ref 0–37)
Albumin: 3.9 g/dL (ref 3.5–5.2)
Alkaline Phosphatase: 51 U/L (ref 39–117)
BILIRUBIN TOTAL: 0.5 mg/dL (ref 0.2–1.2)
BUN: 18 mg/dL (ref 6–23)
CO2: 25 meq/L (ref 19–32)
Calcium: 9.1 mg/dL (ref 8.4–10.5)
Chloride: 106 mEq/L (ref 96–112)
Creatinine, Ser: 0.81 mg/dL (ref 0.40–1.20)
GFR: 78.55 mL/min (ref >60.00)
GLUCOSE: 95 mg/dL (ref 70–99)
Potassium: 4.2 mEq/L (ref 3.5–5.1)
Sodium: 138 mEq/L (ref 135–145)
Total Protein: 7.1 g/dL (ref 6.0–8.3)

## 2016-07-28 LAB — LIPID PANEL
CHOL/HDL RATIO: 3
Cholesterol: 212 mg/dL — ABNORMAL HIGH (ref 0–200)
HDL: 63 mg/dL (ref >39.00)
LDL Cholesterol: 125 mg/dL — ABNORMAL HIGH (ref 0–99)
NONHDL: 149.13
Triglycerides: 122 mg/dL (ref 0.0–149.0)
VLDL: 24.4 mg/dL (ref 0.0–40.0)

## 2016-07-28 LAB — TSH: TSH: 5.2 u[IU]/mL — ABNORMAL HIGH (ref 0.35–4.50)

## 2016-07-30 ENCOUNTER — Encounter: Payer: Self-pay | Admitting: Family Medicine

## 2016-08-03 ENCOUNTER — Encounter: Payer: Self-pay | Admitting: Family Medicine

## 2016-08-03 ENCOUNTER — Other Ambulatory Visit: Payer: Self-pay | Admitting: Family Medicine

## 2016-08-03 DIAGNOSIS — E039 Hypothyroidism, unspecified: Secondary | ICD-10-CM

## 2016-08-03 MED ORDER — LEVOTHYROXINE SODIUM 100 MCG PO TABS: 100.0000 ug | ORAL_TABLET | Freq: Every day | ORAL | 1 refills | Status: DC

## 2016-08-04 ENCOUNTER — Telehealth: Payer: Self-pay

## 2016-08-04 DIAGNOSIS — E039 Hypothyroidism, unspecified: Secondary | ICD-10-CM

## 2016-08-04 DIAGNOSIS — E782 Mixed hyperlipidemia: Secondary | ICD-10-CM

## 2016-08-04 MED ORDER — LEVOTHYROXINE SODIUM 112 MCG PO TABS: 112.0000 ug | ORAL_TABLET | Freq: Every day | ORAL | 3 refills | Status: DC

## 2016-08-04 NOTE — Telephone Encounter (Signed)
Spoke with pt, pt state she understand results, and new medication instruction. Sent new rx to pharmacy. Pt had no further questions or concerns at this time. LB

## 2016-11-03 ENCOUNTER — Encounter: Payer: Self-pay | Admitting: Family Medicine

## 2016-11-03 ENCOUNTER — Other Ambulatory Visit (INDEPENDENT_AMBULATORY_CARE_PROVIDER_SITE_OTHER): Payer: BC Managed Care – PPO

## 2016-11-03 DIAGNOSIS — E039 Hypothyroidism, unspecified: Secondary | ICD-10-CM

## 2016-11-03 DIAGNOSIS — E782 Mixed hyperlipidemia: Secondary | ICD-10-CM | POA: Diagnosis not present

## 2016-11-03 LAB — COMPREHENSIVE METABOLIC PANEL
ALBUMIN: 3.8 g/dL (ref 3.5–5.2)
ALK PHOS: 52 U/L (ref 39–117)
ALT: 11 U/L (ref 0–35)
AST: 14 U/L (ref 0–37)
BUN: 15 mg/dL (ref 6–23)
CALCIUM: 9.4 mg/dL (ref 8.4–10.5)
CHLORIDE: 107 meq/L (ref 96–112)
CO2: 28 mEq/L (ref 19–32)
Creatinine, Ser: 0.76 mg/dL (ref 0.40–1.20)
GFR: 84.46 mL/min (ref >60.00)
Glucose, Bld: 107 mg/dL — ABNORMAL HIGH (ref 70–99)
Potassium: 4.9 mEq/L (ref 3.5–5.1)
SODIUM: 142 meq/L (ref 135–145)
TOTAL PROTEIN: 7.1 g/dL (ref 6.0–8.3)
Total Bilirubin: 0.5 mg/dL (ref 0.2–1.2)

## 2016-11-03 LAB — LIPID PANEL
CHOLESTEROL: 183 mg/dL (ref 0–200)
HDL: 60.6 mg/dL (ref >39.00)
LDL Cholesterol: 103 mg/dL — ABNORMAL HIGH (ref 0–99)
NonHDL: 122.33
TRIGLYCERIDES: 99 mg/dL (ref 0.0–149.0)
Total CHOL/HDL Ratio: 3
VLDL: 19.8 mg/dL (ref 0.0–40.0)

## 2016-11-03 LAB — TSH: TSH: 2.46 u[IU]/mL (ref 0.35–4.50)

## 2016-11-04 MED ORDER — LEVOTHYROXINE SODIUM 112 MCG PO TABS: 112.0000 ug | ORAL_TABLET | Freq: Every day | ORAL | 3 refills | Status: DC

## 2016-12-15 ENCOUNTER — Other Ambulatory Visit: Payer: Self-pay | Admitting: Nurse Practitioner

## 2016-12-15 DIAGNOSIS — Z1231 Encounter for screening mammogram for malignant neoplasm of breast: Secondary | ICD-10-CM

## 2017-01-25 ENCOUNTER — Ambulatory Visit
Admission: RE | Admit: 2017-01-25 | Discharge: 2017-01-25 | Disposition: A | Discharge status: Home / Self Care | Payer: BC Managed Care – PPO | Source: Ambulatory Visit | Attending: Nurse Practitioner | Admitting: Nurse Practitioner

## 2017-01-25 DIAGNOSIS — Z1231 Encounter for screening mammogram for malignant neoplasm of breast: Secondary | ICD-10-CM

## 2017-06-16 ENCOUNTER — Encounter: Payer: Self-pay | Admitting: Family Medicine

## 2017-06-16 ENCOUNTER — Ambulatory Visit (INDEPENDENT_AMBULATORY_CARE_PROVIDER_SITE_OTHER): Payer: BC Managed Care – PPO | Admitting: Family Medicine

## 2017-06-16 VITALS — BP 122/74 | HR 75 | Temp 98.6°F | Resp 16 | Ht 64.57 in | Wt 174.4 lb

## 2017-06-16 DIAGNOSIS — E039 Hypothyroidism, unspecified: Secondary | ICD-10-CM

## 2017-06-16 DIAGNOSIS — Z0000 Encounter for general adult medical examination without abnormal findings: Secondary | ICD-10-CM | POA: Diagnosis not present

## 2017-06-16 NOTE — Progress Notes (Signed)
Subjective:  I acted as a Neurosurgeon for [employer]. Fuller Song, RMA   Patient ID: [name], female    DOB: 03-13-1966, 52 y.o.   MRN: [id number]  Chief Complaint  Patient presents with  . Annual Exam    HPI  Patient is in today for annual exam.  No complaints.  Patient Care Team: Zola Button, Grayling Congress, DO as PCP - General Gynecology, [facility] (Obstetrics and Gynecology) Gerald Leitz, MD as Consulting Physician (Obstetrics and Gynecology)   Past Medical History:  Diagnosis Date  . Arthritis   . Family history of anesthesia complication    mother severe N/V  . Hypothyroid     Past Surgical History:  Procedure Laterality Date  . NO PAST SURGERIES    . TOTAL HIP ARTHROPLASTY Right 06/29/2013   Procedure: RIGHT TOTAL HIP ARTHROPLASTY ANTERIOR APPROACH;  Surgeon: Kathryne Hitch, MD;  Location: [facility];  Service: Orthopedics;  Laterality: Right;    Family History  Problem Relation Age of Onset  . Diabetes Paternal Grandfather   . Diabetes Father   . Throat cancer Father   . Heart disease Father        Heart transplant  . Hypertension Mother   . Breast cancer Unknown        Aunt    Social History   Socioeconomic History  . Marital status: Married    Spouse name: Not on file  . Number of children: Not on file  . Years of education: Not on file  . Highest education level: Not on file  Social Needs  . Financial resource strain: Not on file  . Food insecurity - worry: Not on file  . Food insecurity - inability: Not on file  . Transportation needs - medical: Not on file  . Transportation needs - non-medical: Not on file  Occupational History  . Not on file  Tobacco Use  . Smoking status: Never Smoker  . Smokeless tobacco: Never Used  Substance and Sexual Activity  . Alcohol use: Yes    Comment: Moderate  . Drug use: No  . Sexual activity: Yes  Other Topics Concern  . Not on file  Social History Narrative   Just started going to gym   Lives with husband and 2 kids    Outpatient Medications Prior to Visit  Medication Sig Dispense Refill  . levothyroxine (SYNTHROID, LEVOTHROID) 112 MCG tablet Take 1 tablet (112 mcg total) by mouth daily. 90 tablet 3  . norethindrone-ethinyl estradiol (LOESTRIN FE 1/20) 1-20 MG-MCG tablet Take 1 tablet by mouth daily.     No facility-administered medications prior to visit.     No Known Allergies  Review of Systems  Constitutional: Negative for chills, fever and malaise/fatigue.  HENT: Negative for congestion and hearing loss.   Eyes: Negative for discharge.  Respiratory: Negative for cough, sputum production and shortness of breath.   Cardiovascular: Negative for chest pain, palpitations and leg swelling.  Gastrointestinal: Negative for abdominal pain, blood in stool, constipation, diarrhea, heartburn, nausea and vomiting.  Genitourinary: Negative for dysuria, frequency, hematuria and urgency.  Musculoskeletal: Negative for back pain, falls and myalgias.  Skin: Negative for rash.  Neurological: Negative for dizziness, sensory change, loss of consciousness, weakness and headaches.  Endo/Heme/Allergies: Negative for environmental allergies. Does not bruise/bleed easily.  Psychiatric/Behavioral: Negative for depression and suicidal ideas. The patient is not nervous/anxious and does not have insomnia.        Objective:    Physical Exam  Constitutional:  She is oriented to person, place, and time. She appears well-developed and well-nourished. No distress.  HENT:  Right Ear: External ear normal.  Left Ear: External ear normal.  Nose: Nose normal.  Mouth/Throat: Oropharynx is clear and moist.  Eyes: EOM are normal. Pupils are equal, round, and reactive to light.  Neck: Normal range of motion. Neck supple.  Cardiovascular: Normal rate, regular rhythm and normal heart sounds.  No murmur heard. Pulmonary/Chest: Effort normal and breath sounds normal. No respiratory distress. She has  no wheezes. She has no rales. She exhibits no tenderness.  Genitourinary:  Genitourinary Comments: Pelvic and breast-- per gyn  Neurological: She is alert and oriented to person, place, and time.  Psychiatric: She has a normal mood and affect. Her behavior is normal. Judgment and thought content normal.  Nursing note and vitals reviewed.   BP 122/74 (BP Location: Left Arm, Patient Position: Sitting, Cuff Size: Normal)   Pulse 75   Temp 98.6 F (37 C) (Oral)   Resp 16   Ht 5' 4.57" (1.64 m)   Wt 174 lb 6.4 oz (79.1 kg)   SpO2 98%   BMI 29.41 kg/m  Wt Readings from Last 3 Encounters:  06/16/17 174 lb 6.4 oz (79.1 kg)  05/20/16 187 lb 12.8 oz (85.2 kg)  04/10/15 187 lb 12.8 oz (85.2 kg)   BP Readings from Last 3 Encounters:  06/16/17 122/74  05/20/16 128/68  04/10/15 110/78     Immunization History  Administered Date(s) Administered  . Td 05/01/2010    Health Maintenance  Topic Date Due  . PAP SMEAR  10/28/2017  . MAMMOGRAM  01/25/2018  . TETANUS/TDAP  05/01/2020  . COLONOSCOPY  03/01/2024  . INFLUENZA VACCINE  Completed  . Hepatitis C Screening  Completed  . HIV Screening  Completed    Lab Results  Component Value Date   WBC 8.2 05/20/2016   HGB 14.0 05/20/2016   HCT 41.8 05/20/2016   PLT 247.0 05/20/2016   GLUCOSE 107 (H) 11/03/2016   CHOL 183 11/03/2016   TRIG 99.0 11/03/2016   HDL 60.60 11/03/2016   LDLDIRECT 159.6 10/19/2012   LDLCALC 103 (H) 11/03/2016   ALT 11 11/03/2016   AST 14 11/03/2016   NA 142 11/03/2016   K 4.9 11/03/2016   CL 107 11/03/2016   CREATININE 0.76 11/03/2016   BUN 15 11/03/2016   CO2 28 11/03/2016   TSH 2.46 11/03/2016   INR 1.00 06/25/2013    Lab Results  Component Value Date   TSH 2.46 11/03/2016   Lab Results  Component Value Date   WBC 8.2 05/20/2016   HGB 14.0 05/20/2016   HCT 41.8 05/20/2016   MCV 93.2 05/20/2016   PLT 247.0 05/20/2016   Lab Results  Component Value Date   NA 142 11/03/2016   K 4.9  11/03/2016   CO2 28 11/03/2016   GLUCOSE 107 (H) 11/03/2016   BUN 15 11/03/2016   CREATININE 0.76 11/03/2016   BILITOT 0.5 11/03/2016   ALKPHOS 52 11/03/2016   AST 14 11/03/2016   ALT 11 11/03/2016   PROT 7.1 11/03/2016   ALBUMIN 3.8 11/03/2016   CALCIUM 9.4 11/03/2016   GFR 84.46 11/03/2016   Lab Results  Component Value Date   CHOL 183 11/03/2016   Lab Results  Component Value Date   HDL 60.60 11/03/2016   Lab Results  Component Value Date   LDLCALC 103 (H) 11/03/2016   Lab Results  Component Value Date   TRIG 99.0  11/03/2016   Lab Results  Component Value Date   CHOLHDL 3 11/03/2016   No results found for: HGBA1C       Assessment & Plan:   Problem List Items Addressed This Visit      Unprioritized   Hypothyroidism - Primary    Check labs con't synthroid      Relevant Orders   TSH   Preventative health care    ghm utd Check labs  See avs      Relevant Orders   Lipid panel   TSH   CBC with Differential/Platelet   Comprehensive metabolic panel      I have discontinued Janelle Berkley's norethindrone-ethinyl estradiol. I am also having her maintain her levothyroxine.  No orders of the defined types were placed in this encounter.   CMA served as Neurosurgeonscribe during this visit. History, Physical and Plan performed by medical provider. Documentation and orders reviewed and attested to.  Donato SchultzYvonne R Lowne Chase, DO

## 2017-06-16 NOTE — Patient Instructions (Signed)
Preventive Care 40-64 Years, Female Preventive care refers to lifestyle choices and visits with your health care provider that can promote health and wellness. What does preventive care include?  A yearly physical exam. This is also called an annual well check.  Dental exams once or twice a year.  Routine eye exams. Ask your health care provider how often you should have your eyes checked.  Personal lifestyle choices, including: ? Daily care of your teeth and gums. ? Regular physical activity. ? Eating a healthy diet. ? Avoiding tobacco and drug use. ? Limiting alcohol use. ? Practicing safe sex. ? Taking low-dose aspirin daily starting at age 31. ? Taking vitamin and mineral supplements as recommended by your health care provider. What happens during an annual well check? The services and screenings done by your health care provider during your annual well check will depend on your age, overall health, lifestyle risk factors, and family history of disease. Counseling Your health care provider may ask you questions about your:  Alcohol use.  Tobacco use.  Drug use.  Emotional well-being.  Home and relationship well-being.  Sexual activity.  Eating habits.  Work and work Statistician.  Method of birth control.  Menstrual cycle.  Pregnancy history.  Screening You may have the following tests or measurements:  Height, weight, and BMI.  Blood pressure.  Lipid and cholesterol levels. These may be checked every 5 years, or more frequently if you are over 5 years old.  Skin check.  Lung cancer screening. You may have this screening every year starting at age 57 if you have a 30-pack-year history of smoking and currently smoke or have quit within the past 15 years.  Fecal occult blood test (FOBT) of the stool. You may have this test every year starting at age 43.  Flexible sigmoidoscopy or colonoscopy. You may have a sigmoidoscopy every 5 years or a colonoscopy  every 10 years starting at age 51.  Hepatitis C blood test.  Hepatitis B blood test.  Sexually transmitted disease (STD) testing.  Diabetes screening. This is done by checking your blood sugar (glucose) after you have not eaten for a while (fasting). You may have this done every 1-3 years.  Mammogram. This may be done every 1-2 years. Talk to your health care provider about when you should start having regular mammograms. This may depend on whether you have a family history of breast cancer.  BRCA-related cancer screening. This may be done if you have a family history of breast, ovarian, tubal, or peritoneal cancers.  Pelvic exam and Pap test. This may be done every 3 years starting at age 74. Starting at age 29, this may be done every 5 years if you have a Pap test in combination with an HPV test.  Bone density scan. This is done to screen for osteoporosis. You may have this scan if you are at high risk for osteoporosis.  Discuss your test results, treatment options, and if necessary, the need for more tests with your health care provider. Vaccines Your health care provider may recommend certain vaccines, such as:  Influenza vaccine. This is recommended every year.  Tetanus, diphtheria, and acellular pertussis (Tdap, Td) vaccine. You may need a Td booster every 10 years.  Varicella vaccine. You may need this if you have not been vaccinated.  Zoster vaccine. You may need this after age 62.  Measles, mumps, and rubella (MMR) vaccine. You may need at least one dose of MMR if you were born in  1959 or later. You may also need a second dose.  Pneumococcal 13-valent conjugate (PCV13) vaccine. You may need this if you have certain conditions and were not previously vaccinated.  Pneumococcal polysaccharide (PPSV23) vaccine. You may need one or two doses if you smoke cigarettes or if you have certain conditions.  Meningococcal vaccine. You may need this if you have certain  conditions.  Hepatitis A vaccine. You may need this if you have certain conditions or if you travel or work in places where you may be exposed to hepatitis A.  Hepatitis B vaccine. You may need this if you have certain conditions or if you travel or work in places where you may be exposed to hepatitis B.  Haemophilus influenzae type b (Hib) vaccine. You may need this if you have certain conditions.  Talk to your health care provider about which screenings and vaccines you need and how often you need them. This information is not intended to replace advice given to you by your health care provider. Make sure you discuss any questions you have with your health care provider. Document Released: 04/25/2015 Document Revised: 12/17/2015 Document Reviewed: 01/28/2015 Elsevier Interactive Patient Education  2018 Elsevier Inc.  

## 2017-06-16 NOTE — Assessment & Plan Note (Signed)
ghm utd Check labs  See avs  

## 2017-06-16 NOTE — Assessment & Plan Note (Signed)
Check labs con't synthroid 

## 2017-06-17 ENCOUNTER — Other Ambulatory Visit (INDEPENDENT_AMBULATORY_CARE_PROVIDER_SITE_OTHER): Payer: BC Managed Care – PPO

## 2017-06-17 DIAGNOSIS — E039 Hypothyroidism, unspecified: Secondary | ICD-10-CM

## 2017-06-17 DIAGNOSIS — Z0000 Encounter for general adult medical examination without abnormal findings: Secondary | ICD-10-CM | POA: Diagnosis not present

## 2017-06-17 LAB — CBC WITH DIFFERENTIAL/PLATELET
Basophils Absolute: 0 10*3/uL (ref 0.0–0.1)
Basophils Relative: 0.7 % (ref 0.0–3.0)
EOS ABS: 0.2 10*3/uL (ref 0.0–0.7)
EOS PCT: 3.2 % (ref 0.0–5.0)
HEMATOCRIT: 39.6 % (ref 36.0–46.0)
HEMOGLOBIN: 13.5 g/dL (ref 12.0–15.0)
LYMPHS PCT: 34.2 % (ref 12.0–46.0)
Lymphs Abs: 1.9 10*3/uL (ref 0.7–4.0)
MCHC: 34.2 g/dL (ref 30.0–36.0)
MCV: 90.2 fl (ref 78.0–100.0)
MONOS PCT: 7.8 % (ref 3.0–12.0)
Monocytes Absolute: 0.4 10*3/uL (ref 0.1–1.0)
NEUTROS ABS: 3 10*3/uL (ref 1.4–7.7)
Neutrophils Relative %: 54.1 % (ref 43.0–77.0)
PLATELETS: 264 10*3/uL (ref 150.0–400.0)
RBC: 4.39 Mil/uL (ref 3.87–5.11)
RDW: 13 % (ref 11.5–15.5)
WBC: 5.6 10*3/uL (ref 4.0–10.5)

## 2017-06-17 LAB — COMPREHENSIVE METABOLIC PANEL
ALBUMIN: 4.2 g/dL (ref 3.5–5.2)
ALT: 11 U/L (ref 0–35)
AST: 13 U/L (ref 0–37)
Alkaline Phosphatase: 95 U/L (ref 39–117)
BUN: 31 mg/dL — ABNORMAL HIGH (ref 6–23)
CALCIUM: 10 mg/dL (ref 8.4–10.5)
CO2: 28 meq/L (ref 19–32)
Chloride: 105 mEq/L (ref 96–112)
Creatinine, Ser: 0.69 mg/dL (ref 0.40–1.20)
GFR: 94.2 mL/min (ref >60.00)
Glucose, Bld: 92 mg/dL (ref 70–99)
POTASSIUM: 4.2 meq/L (ref 3.5–5.1)
Sodium: 141 mEq/L (ref 135–145)
Total Bilirubin: 0.7 mg/dL (ref 0.2–1.2)
Total Protein: 7.1 g/dL (ref 6.0–8.3)

## 2017-06-17 LAB — LIPID PANEL
CHOL/HDL RATIO: 3
CHOLESTEROL: 198 mg/dL (ref 0–200)
HDL: 73.8 mg/dL (ref >39.00)
LDL CALC: 107 mg/dL — AB (ref 0–99)
NonHDL: 124.34
Triglycerides: 86 mg/dL (ref 0.0–149.0)
VLDL: 17.2 mg/dL (ref 0.0–40.0)

## 2017-06-17 LAB — TSH: TSH: 0.05 u[IU]/mL — ABNORMAL LOW (ref 0.35–4.50)

## 2017-06-21 ENCOUNTER — Encounter: Payer: Self-pay | Admitting: Family Medicine

## 2017-06-21 DIAGNOSIS — E039 Hypothyroidism, unspecified: Secondary | ICD-10-CM

## 2017-06-21 MED ORDER — LEVOTHYROXINE SODIUM 100 MCG PO TABS: 100.0000 ug | ORAL_TABLET | Freq: Every day | ORAL | 0 refills | Status: DC

## 2017-06-21 NOTE — Addendum Note (Signed)
Addended byConrad Remerton: Shannon Balthazar D on: 06/21/2017 03:18 PM   Modules accepted: Orders

## 2017-06-21 NOTE — Telephone Encounter (Signed)
Notes recorded by Donato SchultzLowne Chase, Yvonne R, DO on 06/18/2017 at 3:18 PM EST Hyperthyroidism--- dec synthroid to 100mcg #30 1 po qd, 2 refills Recheck 2 months  TSH--- lab appointment

## 2017-09-13 ENCOUNTER — Other Ambulatory Visit (INDEPENDENT_AMBULATORY_CARE_PROVIDER_SITE_OTHER): Payer: BC Managed Care – PPO

## 2017-09-13 DIAGNOSIS — E039 Hypothyroidism, unspecified: Secondary | ICD-10-CM | POA: Diagnosis not present

## 2017-09-13 LAB — TSH: TSH: 0.78 u[IU]/mL (ref 0.35–4.50)

## 2017-09-14 ENCOUNTER — Other Ambulatory Visit: Payer: Self-pay | Admitting: Family Medicine

## 2017-11-10 ENCOUNTER — Other Ambulatory Visit (HOSPITAL_COMMUNITY)
Admission: RE | Admit: 2017-11-10 | Discharge: 2017-11-10 | Disposition: A | Discharge status: Home / Self Care | Payer: BC Managed Care – PPO | Source: Ambulatory Visit | Attending: Nurse Practitioner | Admitting: Nurse Practitioner

## 2017-11-10 ENCOUNTER — Other Ambulatory Visit: Payer: Self-pay | Admitting: Nurse Practitioner

## 2017-11-10 DIAGNOSIS — Z01419 Encounter for gynecological examination (general) (routine) without abnormal findings: Secondary | ICD-10-CM | POA: Insufficient documentation

## 2017-11-14 LAB — CYTOLOGY - PAP
DIAGNOSIS: NEGATIVE
HPV: NOT DETECTED

## 2017-12-09 ENCOUNTER — Other Ambulatory Visit: Payer: Self-pay | Admitting: *Deleted

## 2017-12-09 MED ORDER — LEVOTHYROXINE SODIUM 100 MCG PO TABS: 100.0000 ug | ORAL_TABLET | Freq: Every day | ORAL | 1 refills | Status: DC

## 2017-12-14 ENCOUNTER — Other Ambulatory Visit: Payer: Self-pay | Admitting: Nurse Practitioner

## 2017-12-14 DIAGNOSIS — Z1231 Encounter for screening mammogram for malignant neoplasm of breast: Secondary | ICD-10-CM

## 2018-01-30 ENCOUNTER — Ambulatory Visit
Admission: RE | Admit: 2018-01-30 | Discharge: 2018-01-30 | Disposition: A | Discharge status: Home / Self Care | Payer: BC Managed Care – PPO | Source: Ambulatory Visit | Attending: Nurse Practitioner | Admitting: Nurse Practitioner

## 2018-01-30 DIAGNOSIS — Z1231 Encounter for screening mammogram for malignant neoplasm of breast: Secondary | ICD-10-CM

## 2018-03-27 ENCOUNTER — Other Ambulatory Visit (INDEPENDENT_AMBULATORY_CARE_PROVIDER_SITE_OTHER): Payer: BC Managed Care – PPO

## 2018-03-27 DIAGNOSIS — E039 Hypothyroidism, unspecified: Secondary | ICD-10-CM | POA: Diagnosis not present

## 2018-03-27 LAB — TSH: TSH: 1.21 u[IU]/mL (ref 0.35–4.50)

## 2018-03-27 NOTE — Addendum Note (Signed)
Addended by: Mervin KungFERGERSON, Lewanda Perea A on: 03/27/2018 07:27 AM   Modules accepted: Orders

## 2018-03-28 ENCOUNTER — Encounter: Payer: Self-pay | Admitting: Family Medicine

## 2018-06-07 ENCOUNTER — Other Ambulatory Visit: Payer: Self-pay | Admitting: Family Medicine

## 2018-10-05 ENCOUNTER — Encounter: Payer: Self-pay | Admitting: Family Medicine

## 2018-10-31 ENCOUNTER — Other Ambulatory Visit: Payer: Self-pay

## 2018-10-31 ENCOUNTER — Ambulatory Visit (INDEPENDENT_AMBULATORY_CARE_PROVIDER_SITE_OTHER): Payer: BC Managed Care – PPO | Admitting: Family Medicine

## 2018-10-31 ENCOUNTER — Encounter: Payer: Self-pay | Admitting: Family Medicine

## 2018-10-31 VITALS — BP 132/77 | HR 60 | Temp 98.4°F | Resp 18 | Ht 64.0 in | Wt 183.2 lb

## 2018-10-31 DIAGNOSIS — E039 Hypothyroidism, unspecified: Secondary | ICD-10-CM

## 2018-10-31 DIAGNOSIS — Z23 Encounter for immunization: Secondary | ICD-10-CM

## 2018-10-31 DIAGNOSIS — Z0000 Encounter for general adult medical examination without abnormal findings: Secondary | ICD-10-CM | POA: Diagnosis not present

## 2018-10-31 LAB — CBC WITH DIFFERENTIAL/PLATELET
Basophils Absolute: 0.1 10*3/uL (ref 0.0–0.1)
Basophils Relative: 1 % (ref 0.0–3.0)
Eosinophils Absolute: 0.1 10*3/uL (ref 0.0–0.7)
Eosinophils Relative: 1.7 % (ref 0.0–5.0)
HCT: 42.1 % (ref 36.0–46.0)
Hemoglobin: 13.8 g/dL (ref 12.0–15.0)
Lymphocytes Relative: 30.8 % (ref 12.0–46.0)
Lymphs Abs: 2 10*3/uL (ref 0.7–4.0)
MCHC: 32.8 g/dL (ref 30.0–36.0)
MCV: 90.9 fl (ref 78.0–100.0)
Monocytes Absolute: 0.4 10*3/uL (ref 0.1–1.0)
Monocytes Relative: 6.2 % (ref 3.0–12.0)
Neutro Abs: 3.9 10*3/uL (ref 1.4–7.7)
Neutrophils Relative %: 60.3 % (ref 43.0–77.0)
Platelets: 236 10*3/uL (ref 150.0–400.0)
RBC: 4.63 Mil/uL (ref 3.87–5.11)
RDW: 13.4 % (ref 11.5–15.5)
WBC: 6.4 10*3/uL (ref 4.0–10.5)

## 2018-10-31 LAB — COMPREHENSIVE METABOLIC PANEL
ALT: 11 U/L (ref 0–35)
AST: 14 U/L (ref 0–37)
Albumin: 4.6 g/dL (ref 3.5–5.2)
Alkaline Phosphatase: 98 U/L (ref 39–117)
BUN: 13 mg/dL (ref 6–23)
CO2: 27 mEq/L (ref 19–32)
Calcium: 9.5 mg/dL (ref 8.4–10.5)
Chloride: 104 mEq/L (ref 96–112)
Creatinine, Ser: 0.69 mg/dL (ref 0.40–1.20)
GFR: 88.18 mL/min (ref >60.00)
Glucose, Bld: 94 mg/dL (ref 70–99)
Potassium: 4.2 mEq/L (ref 3.5–5.1)
Sodium: 141 mEq/L (ref 135–145)
Total Bilirubin: 0.8 mg/dL (ref 0.2–1.2)
Total Protein: 6.8 g/dL (ref 6.0–8.3)

## 2018-10-31 LAB — LIPID PANEL
Cholesterol: 211 mg/dL — ABNORMAL HIGH (ref 0–200)
HDL: 74.8 mg/dL (ref >39.00)
LDL Cholesterol: 107 mg/dL — ABNORMAL HIGH (ref 0–99)
NonHDL: 136.45
Total CHOL/HDL Ratio: 3
Triglycerides: 148 mg/dL (ref 0.0–149.0)
VLDL: 29.6 mg/dL (ref 0.0–40.0)

## 2018-10-31 LAB — TSH: TSH: 0.64 u[IU]/mL (ref 0.35–4.50)

## 2018-10-31 NOTE — Progress Notes (Signed)
Subjective:     [name] is a 53 y.o. female and is here for a comprehensive physical exam. The patient reports no problems.  Social History   Socioeconomic History  . Marital status: Married    Spouse name: Not on file  . Number of children: Not on file  . Years of education: Not on file  . Highest education level: Not on file  Occupational History  . Not on file  Social Needs  . Financial resource strain: Not on file  . Food insecurity    Worry: Not on file    Inability: Not on file  . Transportation needs    Medical: Not on file    Non-medical: Not on file  Tobacco Use  . Smoking status: Never Smoker  . Smokeless tobacco: Never Used  Substance and Sexual Activity  . Alcohol use: Yes    Comment: Moderate  . Drug use: No  . Sexual activity: Yes  Lifestyle  . Physical activity    Days per week: Not on file    Minutes per session: Not on file  . Stress: Not on file  Relationships  . Social Herbalist on phone: Not on file    Gets together: Not on file    Attends religious service: Not on file    Active member of club or organization: Not on file    Attends meetings of clubs or organizations: Not on file    Relationship status: Not on file  . Intimate partner violence    Fear of current or ex partner: Not on file    Emotionally abused: Not on file    Physically abused: Not on file    Forced sexual activity: Not on file  Other Topics Concern  . Not on file  Social History Narrative   Just started going to gym   Lives with husband and 2 kids   Health Maintenance  Topic Date Due  . INFLUENZA VACCINE  11/11/2018  . MAMMOGRAM  01/31/2019  . TETANUS/TDAP  05/01/2020  . PAP SMEAR-Modifier  11/10/2020  . COLONOSCOPY  03/01/2024  . Hepatitis C Screening  Completed  . HIV Screening  Completed    The following portions of the patient's history were reviewed and updated as appropriate:  She  has a past medical history of Arthritis, Family history  of anesthesia complication, and Hypothyroid. She does not have any pertinent problems on file. She  has a past surgical history that includes No past surgeries and Total hip arthroplasty (Right, 06/29/2013). Her family history includes Breast cancer in her maternal aunt and unknown relative; Diabetes in her father and paternal grandfather; Heart disease in her father; Hypertension in her mother; Throat cancer in her father. She  reports that she has never smoked. She has never used smokeless tobacco. She reports current alcohol use. She reports that she does not use drugs. She has a current medication list which includes the following prescription(s): synthroid. Current Outpatient Medications on File Prior to Visit  Medication Sig Dispense Refill  . SYNTHROID 100 MCG tablet TAKE 1 TABLET DAILY BEFORE BREAKFAST 90 tablet 1   No current facility-administered medications on file prior to visit.    She has No Known Allergies..  Review of Systems Review of Systems  Constitutional: Negative for activity change, appetite change and fatigue.  HENT: Negative for hearing loss, congestion, tinnitus and ear discharge.  dentist q54m Eyes: Negative for visual disturbance (see optho q1y -- vision corrected to  20/20 with glasses).  Respiratory: Negative for cough, chest tightness and shortness of breath.   Cardiovascular: Negative for chest pain, palpitations and leg swelling.  Gastrointestinal: Negative for abdominal pain, diarrhea, constipation and abdominal distention.  Genitourinary: Negative for urgency, frequency, decreased urine volume and difficulty urinating.  Musculoskeletal: Negative for back pain, arthralgias and gait problem.  Skin: Negative for color change, pallor and rash.  Neurological: Negative for dizziness, light-headedness, numbness and headaches.  Hematological: Negative for adenopathy. Does not bruise/bleed easily.  Psychiatric/Behavioral: Negative for suicidal ideas, confusion, sleep  disturbance, self-injury, dysphoric mood, decreased concentration and agitation.       Objective:    BP 132/77 (BP Location: Left Arm, Patient Position: Sitting, Cuff Size: Normal)   Pulse 60   Temp 98.4 F (36.9 C) (Oral)   Resp 18   Ht 5\' 4"  (1.626 m)   Wt 183 lb 3.2 oz (83.1 kg)   LMP 05/05/2016   SpO2 100%   BMI 31.45 kg/m  General appearance: alert, cooperative, appears stated age and no distress Head: Normocephalic, without obvious abnormality, atraumatic Eyes: conjunctivae/corneas clear. PERRL, EOM's intact. Fundi benign. Ears: normal TM's and external ear canals both ears Nose: Nares normal. Septum midline. Mucosa normal. No drainage or sinus tenderness. Throat: lips, mucosa, and tongue normal; teeth and gums normal Neck: no adenopathy, no carotid bruit, no JVD, supple, symmetrical, trachea midline and thyroid not enlarged, symmetric, no tenderness/mass/nodules Back: symmetric, no curvature. ROM normal. No CVA tenderness. Lungs: clear to auscultation bilaterally Breasts: gyn Heart: regular rate and rhythm, S1, S2 normal, no murmur, click, rub or gallop Abdomen: soft, non-tender; bowel sounds normal; no masses,  no organomegaly Pelvic: deferred--gyn Extremities: extremities normal, atraumatic, no cyanosis or edema Pulses: 2+ and symmetric Skin: Skin color, texture, turgor normal. No rashes or lesions Lymph nodes: Cervical, supraclavicular, and axillary nodes normal. Neurologic: Alert and oriented X 3, normal strength and tone. Normal symmetric reflexes. Normal coordination and gait    Assessment:    Healthy female exam.      Plan:    ghm utd Check labs  shingrix #1 given  See After Visit Summary for Counseling Recommendations

## 2018-10-31 NOTE — Patient Instructions (Signed)

## 2018-11-01 ENCOUNTER — Encounter: Payer: Self-pay | Admitting: Family Medicine

## 2018-11-01 MED ORDER — LEVOTHYROXINE SODIUM 100 MCG PO TABS: 100.0000 ug | ORAL_TABLET | Freq: Every day | ORAL | 1 refills | Status: DC

## 2018-11-30 ENCOUNTER — Other Ambulatory Visit: Payer: Self-pay | Admitting: Nurse Practitioner

## 2018-11-30 DIAGNOSIS — Z1231 Encounter for screening mammogram for malignant neoplasm of breast: Secondary | ICD-10-CM

## 2018-12-25 ENCOUNTER — Encounter: Payer: Self-pay | Admitting: Family Medicine

## 2019-01-03 ENCOUNTER — Other Ambulatory Visit: Payer: Self-pay

## 2019-01-03 ENCOUNTER — Ambulatory Visit (INDEPENDENT_AMBULATORY_CARE_PROVIDER_SITE_OTHER): Payer: BC Managed Care – PPO | Admitting: *Deleted

## 2019-01-03 ENCOUNTER — Encounter: Payer: Self-pay | Admitting: Family Medicine

## 2019-01-03 DIAGNOSIS — Z23 Encounter for immunization: Secondary | ICD-10-CM

## 2019-01-03 NOTE — Progress Notes (Signed)
Patient here today for flu and 2nd shingles vaccines  Vaccines given and patient tolerated well.

## 2019-02-06 ENCOUNTER — Ambulatory Visit
Admission: RE | Admit: 2019-02-06 | Discharge: 2019-02-06 | Disposition: A | Discharge status: Home / Self Care | Payer: BC Managed Care – PPO | Source: Ambulatory Visit | Attending: Nurse Practitioner | Admitting: Nurse Practitioner

## 2019-02-06 ENCOUNTER — Other Ambulatory Visit: Payer: Self-pay

## 2019-02-06 DIAGNOSIS — Z1231 Encounter for screening mammogram for malignant neoplasm of breast: Secondary | ICD-10-CM

## 2019-05-22 ENCOUNTER — Other Ambulatory Visit: Payer: Self-pay | Admitting: Family Medicine

## 2019-10-31 ENCOUNTER — Other Ambulatory Visit: Payer: Self-pay

## 2019-10-31 ENCOUNTER — Other Ambulatory Visit: Payer: Self-pay | Admitting: Family Medicine

## 2019-10-31 MED ORDER — LEVOTHYROXINE SODIUM 100 MCG PO TABS: 100.0000 ug | ORAL_TABLET | Freq: Every day | ORAL | 0 refills | Status: DC

## 2019-11-02 ENCOUNTER — Encounter: Payer: BC Managed Care – PPO | Admitting: Family Medicine

## 2019-11-23 ENCOUNTER — Other Ambulatory Visit: Payer: Self-pay | Admitting: Nurse Practitioner

## 2019-11-23 ENCOUNTER — Ambulatory Visit (INDEPENDENT_AMBULATORY_CARE_PROVIDER_SITE_OTHER): Payer: BC Managed Care – PPO | Admitting: Family Medicine

## 2019-11-23 ENCOUNTER — Encounter: Payer: Self-pay | Admitting: Family Medicine

## 2019-11-23 ENCOUNTER — Other Ambulatory Visit: Payer: Self-pay

## 2019-11-23 VITALS — BP 110/60 | HR 64 | Temp 98.1°F | Resp 18 | Ht 64.0 in | Wt 180.4 lb

## 2019-11-23 DIAGNOSIS — Z0000 Encounter for general adult medical examination without abnormal findings: Secondary | ICD-10-CM

## 2019-11-23 DIAGNOSIS — Z1231 Encounter for screening mammogram for malignant neoplasm of breast: Secondary | ICD-10-CM

## 2019-11-23 LAB — COMPREHENSIVE METABOLIC PANEL
AG Ratio: 1.7 (calc) (ref 1.0–2.5)
ALT: 14 U/L (ref 6–29)
AST: 16 U/L (ref 10–35)
Albumin: 4.3 g/dL (ref 3.6–5.1)
Alkaline phosphatase (APISO): 90 U/L (ref 37–153)
BUN: 23 mg/dL (ref 7–25)
CO2: 25 mmol/L (ref 20–32)
Calcium: 9.6 mg/dL (ref 8.6–10.4)
Chloride: 102 mmol/L (ref 98–110)
Creat: 0.78 mg/dL (ref 0.50–1.05)
Globulin: 2.5 g/dL (calc) (ref 1.9–3.7)
Glucose, Bld: 78 mg/dL (ref 65–99)
Potassium: 4.5 mmol/L (ref 3.5–5.3)
Sodium: 138 mmol/L (ref 135–146)
Total Bilirubin: 0.5 mg/dL (ref 0.2–1.2)
Total Protein: 6.8 g/dL (ref 6.1–8.1)

## 2019-11-23 NOTE — Patient Instructions (Signed)

## 2019-11-23 NOTE — Progress Notes (Signed)
Subjective:     [name] is a 54 y.o. female and is here for a comprehensive physical exam. The patient reports no problems.  Social History   Socioeconomic History  . Marital status: Married    Spouse name: Not on file  . Number of children: Not on file  . Years of education: Not on file  . Highest education level: Not on file  Occupational History  . Not on file  Tobacco Use  . Smoking status: Never Smoker  . Smokeless tobacco: Never Used  Substance and Sexual Activity  . Alcohol use: Yes    Comment: Moderate  . Drug use: No  . Sexual activity: Yes  Other Topics Concern  . Not on file  Social History Narrative   Just started going to gym   Lives with husband and 2 kids   Social Determinants of Health   Financial Resource Strain:   . Difficulty of Paying Living Expenses:   Food Insecurity:   . Worried About Programme researcher, broadcasting/film/video in the Last Year:   . Barista in the Last Year:   Transportation Needs:   . Freight forwarder (Medical):   Marland Kitchen Lack of Transportation (Non-Medical):   Physical Activity:   . Days of Exercise per Week:   . Minutes of Exercise per Session:   Stress:   . Feeling of Stress :   Social Connections:   . Frequency of Communication with Friends and Family:   . Frequency of Social Gatherings with Friends and Family:   . Attends Religious Services:   . Active Member of Clubs or Organizations:   . Attends Banker Meetings:   Marland Kitchen Marital Status:   Intimate Partner Violence:   . Fear of Current or Ex-Partner:   . Emotionally Abused:   Marland Kitchen Physically Abused:   . Sexually Abused:    Health Maintenance  Topic Date Due  . INFLUENZA VACCINE  11/11/2019  . MAMMOGRAM  02/06/2020  . TETANUS/TDAP  05/01/2020  . PAP SMEAR-Modifier  11/10/2020  . COLONOSCOPY  03/01/2024  . COVID-19 Vaccine  Completed  . Hepatitis C Screening  Completed  . HIV Screening  Completed    The following portions of the patient's history were  reviewed and updated as appropriate:  She  has a past medical history of Arthritis, Family history of anesthesia complication, and Hypothyroid. She does not have any pertinent problems on file. She  has a past surgical history that includes No past surgeries and Total hip arthroplasty (Right, 06/29/2013). Her family history includes Breast cancer in her maternal aunt and another family member; Diabetes in her father and paternal grandfather; Heart disease in her father; Hypertension in her mother; Throat cancer in her father. She  reports that she has never smoked. She has never used smokeless tobacco. She reports current alcohol use. She reports that she does not use drugs. She has a current medication list which includes the following prescription(s): levothyroxine. Current Outpatient Medications on File Prior to Visit  Medication Sig Dispense Refill  . levothyroxine (SYNTHROID) 100 MCG tablet Take 1 tablet (100 mcg total) by mouth daily before breakfast. 30 tablet 0   No current facility-administered medications on file prior to visit.   She has No Known Allergies..  Review of Systems Review of Systems  Constitutional: Negative for activity change, appetite change and fatigue.  HENT: Negative for hearing loss, congestion, tinnitus and ear discharge.  dentist q94m Eyes: Negative for visual disturbance (see  optho q1y -- vision corrected to 20/20 with glasses).  Respiratory: Negative for cough, chest tightness and shortness of breath.   Cardiovascular: Negative for chest pain, palpitations and leg swelling.  Gastrointestinal: Negative for abdominal pain, diarrhea, constipation and abdominal distention.  Genitourinary: Negative for urgency, frequency, decreased urine volume and difficulty urinating.  Musculoskeletal: Negative for back pain, arthralgias and gait problem.  Skin: Negative for color change, pallor and rash.  Neurological: Negative for dizziness, light-headedness, numbness and  headaches.  Hematological: Negative for adenopathy. Does not bruise/bleed easily.  Psychiatric/Behavioral: Negative for suicidal ideas, confusion, sleep disturbance, self-injury, dysphoric mood, decreased concentration and agitation.       Objective:    BP 110/60 (BP Location: Right Arm, Patient Position: Sitting, Cuff Size: Normal)   Pulse 64   Temp 98.1 F (36.7 C) (Oral)   Resp 18   Ht 5\' 4"  (1.626 m)   Wt 180 lb 6.4 oz (81.8 kg)   LMP 05/05/2016   SpO2 99%   BMI 30.97 kg/m  General appearance: alert, cooperative, appears stated age and no distress Head: Normocephalic, without obvious abnormality, atraumatic Eyes: negative findings: lids and lashes normal, conjunctivae and sclerae normal and pupils equal, round, reactive to light and accomodation Ears: normal TM's and external ear canals both ears Neck: no adenopathy, no carotid bruit, no JVD, supple, symmetrical, trachea midline and thyroid not enlarged, symmetric, no tenderness/mass/nodules Back: symmetric, no curvature. ROM normal. No CVA tenderness. Lungs: clear to auscultation bilaterally Breasts: gyn Heart: regular rate and rhythm, S1, S2 normal, no murmur, click, rub or gallop Abdomen: soft, non-tender; bowel sounds normal; no masses,  no organomegaly Pelvic: deferred--gyn Extremities: extremities normal, atraumatic, no cyanosis or edema Pulses: 2+ and symmetric Skin: Skin color, texture, turgor normal. No rashes or lesions Lymph nodes: Cervical, supraclavicular, and axillary nodes normal. Neurologic: Alert and oriented X 3, normal strength and tone. Normal symmetric reflexes. Normal coordination and gait    Assessment:    Healthy female exam.      Plan:    ghm utd  Check labs  See avs  See After Visit Summary for Counseling Recommendations    1. Preventative health care See above  - CBC with Differential/Platelet - Lipid panel - TSH - Comprehensive metabolic panel

## 2019-11-24 LAB — CBC WITH DIFFERENTIAL/PLATELET
Absolute Monocytes: 559 cells/uL (ref 200–950)
Basophils Absolute: 49 cells/uL (ref 0–200)
Basophils Relative: 0.6 %
Eosinophils Absolute: 57 cells/uL (ref 15–500)
Eosinophils Relative: 0.7 %
HCT: 40.3 % (ref 35.0–45.0)
Hemoglobin: 13.4 g/dL (ref 11.7–15.5)
Lymphs Abs: 2195 cells/uL (ref 850–3900)
MCH: 30.1 pg (ref 27.0–33.0)
MCHC: 33.3 g/dL (ref 32.0–36.0)
MCV: 90.6 fL (ref 80.0–100.0)
MPV: 10.7 fL (ref 7.5–12.5)
Monocytes Relative: 6.9 %
Neutro Abs: 5241 cells/uL (ref 1500–7800)
Neutrophils Relative %: 64.7 %
Platelets: 260 10*3/uL (ref 140–400)
RBC: 4.45 10*6/uL (ref 3.80–5.10)
RDW: 12.8 % (ref 11.0–15.0)
Total Lymphocyte: 27.1 %
WBC: 8.1 10*3/uL (ref 3.8–10.8)

## 2019-11-24 LAB — LIPID PANEL
Cholesterol: 233 mg/dL — ABNORMAL HIGH (ref <200)
HDL: 74 mg/dL (ref >=50)
LDL Cholesterol (Calc): 133 mg/dL (calc) — ABNORMAL HIGH
Non-HDL Cholesterol (Calc): 159 mg/dL (calc) — ABNORMAL HIGH (ref <130)
Total CHOL/HDL Ratio: 3.1 (calc) (ref <5.0)
Triglycerides: 138 mg/dL (ref <150)

## 2019-11-24 LAB — TSH: TSH: 0.77 mIU/L (ref 0.40–4.50)

## 2020-01-05 ENCOUNTER — Other Ambulatory Visit: Payer: Self-pay | Admitting: Family Medicine

## 2020-02-07 ENCOUNTER — Ambulatory Visit: Payer: BC Managed Care – PPO

## 2020-03-10 ENCOUNTER — Ambulatory Visit
Admission: RE | Admit: 2020-03-10 | Discharge: 2020-03-10 | Disposition: A | Discharge status: Home / Self Care | Payer: BC Managed Care – PPO | Source: Ambulatory Visit | Attending: Nurse Practitioner | Admitting: Nurse Practitioner

## 2020-03-10 ENCOUNTER — Other Ambulatory Visit: Payer: Self-pay

## 2020-03-10 DIAGNOSIS — Z1231 Encounter for screening mammogram for malignant neoplasm of breast: Secondary | ICD-10-CM

## 2020-11-28 ENCOUNTER — Encounter: Payer: BC Managed Care – PPO | Admitting: Family Medicine

## 2020-12-01 ENCOUNTER — Telehealth (INDEPENDENT_AMBULATORY_CARE_PROVIDER_SITE_OTHER): Payer: Self-pay | Admitting: Family Medicine

## 2020-12-01 ENCOUNTER — Encounter: Payer: Self-pay | Admitting: Family Medicine

## 2020-12-01 ENCOUNTER — Other Ambulatory Visit: Payer: Self-pay

## 2020-12-01 DIAGNOSIS — J0140 Acute pansinusitis, unspecified: Secondary | ICD-10-CM

## 2020-12-01 DIAGNOSIS — J029 Acute pharyngitis, unspecified: Secondary | ICD-10-CM

## 2020-12-01 DIAGNOSIS — U071 COVID-19: Secondary | ICD-10-CM | POA: Insufficient documentation

## 2020-12-01 MED ORDER — FLUTICASONE PROPIONATE 50 MCG/ACT NA SUSP: 2.0000 | Freq: Every day | NASAL | 6 refills | Status: AC

## 2020-12-01 MED ORDER — MOLNUPIRAVIR EUA 200MG CAPSULE: 4.0000 | ORAL_CAPSULE | Freq: Two times a day (BID) | ORAL | 0 refills | Status: AC

## 2020-12-01 NOTE — Progress Notes (Addendum)
MyChart Video Visit    Virtual Visit via Video Note   This visit type was conducted due to national recommendations for restrictions regarding the COVID-19 Pandemic (e.g. social distancing) in an effort to limit this patient's exposure and mitigate transmission in our community. This patient is at least at moderate risk for complications without adequate follow up. This format is felt to be most appropriate for this patient at this time. Physical exam was limited by quality of the video and audio technology used for the visit. [name] was able to get the patient set up on a video visit.  Patient location: home alone Patient and provider in visit Provider location:   I discussed the limitations of evaluation and management by telemedicine and the availability of in person appointments. The patient expressed understanding and agreed to proceed.  Visit Date: 12/01/2020  Today's healthcare provider: Donato Schultz, DO     Subjective:    Patient ID: [name], female    DOB: 1965-07-18, 55 y.o.   MRN: [id number]  No chief complaint on file.   HPI Patient is in today for + covid ---- symptoms started Saturday into sun with sore throat and sinus congestion.  No fever.   + cough, no body aches.  She is taking motrin and decongestants ---- she tested positive this am.   No other complaints  She describes the sore throat as severe   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   Procedure: RIGHT TOTAL HIP ARTHROPLASTY ANTERIOR APPROACH;  Surgeon: Kathryne Hitch, MD;  Location: [facility];  Service: Orthopedics;  Laterality: Right;    Family History  Problem Relation Age of Onset   Diabetes Paternal Grandfather    Diabetes Father    Throat cancer  Father    Heart disease Father        Heart transplant   Hypertension Mother    Breast cancer Other        Aunt   Breast cancer Maternal Aunt        diagnosed in her 89's    Social History   Socioeconomic History   Marital status: Married    Spouse name: Not on file   Number of children: Not on file   Years of education: Not on file   Highest education level: Not on file  Occupational History   Not on file  Tobacco Use   Smoking status: Never   Smokeless tobacco: Never  Substance and Sexual Activity   Alcohol use: Yes    Comment: Moderate   Drug use: No   Sexual activity: Yes  Other Topics Concern   Not on file  Social History Narrative   Just started going to gym   Lives with husband and 2 kids   Social Determinants of Health   Financial Resource Strain: Not on file  Food Insecurity: Not on file  Transportation Needs: Not on file  Physical Activity: Not on file  Stress: Not on file  Social Connections: Not on file  Intimate Partner Violence: Not on file    Outpatient Medications Prior to Visit  Medication Sig Dispense Refill   levothyroxine (SYNTHROID) 100 MCG tablet Take 1 tablet (100 mcg total) by mouth daily  before breakfast. 90 tablet 3   No facility-administered medications prior to visit.    No Known Allergies  Review of Systems  Constitutional:  Positive for malaise/fatigue. Negative for chills and fever.  HENT:  Positive for congestion, sinus pain and sore throat. Negative for ear pain.   Respiratory:  Positive for cough. Negative for sputum production, shortness of breath and wheezing.   Musculoskeletal:  Negative for joint pain and myalgias.      Objective:    Physical Exam Vitals and nursing note reviewed.  Constitutional:      General: She is not in acute distress.    Appearance: She is well-developed.  Neck:     Thyroid: No thyromegaly.     Vascular: No JVD.  Pulmonary:     Effort: Pulmonary effort is normal.  Neurological:      Mental Status: She is alert and oriented to person, place, and time.    LMP 05/05/2016  Wt Readings from Last 3 Encounters:  11/23/19 180 lb 6.4 oz (81.8 kg)  10/31/18 183 lb 3.2 oz (83.1 kg)  06/16/17 174 lb 6.4 oz (79.1 kg)    Diabetic Foot Exam - Simple   No data filed    Lab Results  Component Value Date   WBC 8.1 11/23/2019   HGB 13.4 11/23/2019   HCT 40.3 11/23/2019   PLT 260 11/23/2019   GLUCOSE 78 11/23/2019   CHOL 233 (H) 11/23/2019   TRIG 138 11/23/2019   HDL 74 11/23/2019   LDLDIRECT 159.6 10/19/2012   LDLCALC 133 (H) 11/23/2019   ALT 14 11/23/2019   AST 16 11/23/2019   NA 138 11/23/2019   K 4.5 11/23/2019   CL 102 11/23/2019   CREATININE 0.78 11/23/2019   BUN 23 11/23/2019   CO2 25 11/23/2019   TSH 0.77 11/23/2019   INR 1.00 06/25/2013    Lab Results  Component Value Date   TSH 0.77 11/23/2019   Lab Results  Component Value Date   WBC 8.1 11/23/2019   HGB 13.4 11/23/2019   HCT 40.3 11/23/2019   MCV 90.6 11/23/2019   PLT 260 11/23/2019   Lab Results  Component Value Date   NA 138 11/23/2019   K 4.5 11/23/2019   CO2 25 11/23/2019   GLUCOSE 78 11/23/2019   BUN 23 11/23/2019   CREATININE 0.78 11/23/2019   BILITOT 0.5 11/23/2019   ALKPHOS 98 10/31/2018   AST 16 11/23/2019   ALT 14 11/23/2019   PROT 6.8 11/23/2019   ALBUMIN 4.6 10/31/2018   CALCIUM 9.6 11/23/2019   GFR 88.18 10/31/2018   Lab Results  Component Value Date   CHOL 233 (H) 11/23/2019   Lab Results  Component Value Date   HDL 74 11/23/2019   Lab Results  Component Value Date   LDLCALC 133 (H) 11/23/2019   Lab Results  Component Value Date   TRIG 138 11/23/2019   Lab Results  Component Value Date   CHOLHDL 3.1 11/23/2019   No results found for: HGBA1C     Assessment & Plan:   Problem List Items Addressed This Visit       Unprioritized   COVID-19 - Primary    Antiviral sent to pharmacy con't otc cough med Antihistamine Pt did not want pred taper   con't tylenol / motrin  Quarantine 5 days and then if symptoms improving she can leave the house with a mask on for 5 more days        Pharyngitis  Tylenol/ IB for pain Drink plently of fluids Call back if symptoms do not improve      Other Visit Diagnoses     Acute non-recurrent pansinusitis       Relevant Medications   fluticasone (FLONASE) 50 MCG/ACT nasal spray       I am having Junie Elsbernd start on molnupiravir EUA and fluticasone. I am also having her maintain her levothyroxine.  Meds ordered this encounter  Medications   molnupiravir EUA 200 mg CAPS    Sig: Take 4 capsules (800 mg total) by mouth 2 (two) times daily for 5 days.    Dispense:  40 capsule    Refill:  0   fluticasone (FLONASE) 50 MCG/ACT nasal spray    Sig: Place 2 sprays into both nostrils daily.    Dispense:  16 g    Refill:  6    I discussed the assessment and treatment plan with the patient. The patient was provided an opportunity to ask questions and all were answered. The patient agreed with the plan and demonstrated an understanding of the instructions.   The patient was advised to call back or seek an in-person evaluation if the symptoms worsen or if the condition fails to improve as anticipated.     Donato Schultz, DO [facility] at [facility] [phone] (phone) [phone] (fax)  [facility]

## 2020-12-01 NOTE — Assessment & Plan Note (Signed)
Tylenol/ IB for pain Drink plently of fluids Call back if symptoms do not improve

## 2020-12-01 NOTE — Assessment & Plan Note (Signed)
Antiviral sent to pharmacy con't otc cough med Antihistamine Pt did not want pred taper  con't tylenol / motrin  Quarantine 5 days and then if symptoms improving she can leave the house with a mask on for 5 more days

## 2020-12-05 ENCOUNTER — Encounter: Payer: BC Managed Care – PPO | Admitting: Family Medicine

## 2020-12-19 ENCOUNTER — Other Ambulatory Visit: Payer: Self-pay | Admitting: Family Medicine

## 2021-01-05 ENCOUNTER — Encounter: Payer: Self-pay | Admitting: Family Medicine

## 2021-01-05 ENCOUNTER — Ambulatory Visit (INDEPENDENT_AMBULATORY_CARE_PROVIDER_SITE_OTHER): Payer: BC Managed Care – PPO | Admitting: Family Medicine

## 2021-01-05 ENCOUNTER — Other Ambulatory Visit: Payer: Self-pay

## 2021-01-05 VITALS — BP 110/80 | HR 66 | Temp 98.1°F | Resp 18 | Ht 64.0 in | Wt 184.2 lb

## 2021-01-05 DIAGNOSIS — Z0000 Encounter for general adult medical examination without abnormal findings: Secondary | ICD-10-CM

## 2021-01-05 DIAGNOSIS — Z23 Encounter for immunization: Secondary | ICD-10-CM | POA: Diagnosis not present

## 2021-01-05 NOTE — Assessment & Plan Note (Signed)
ghm utd Check labs  

## 2021-01-05 NOTE — Patient Instructions (Signed)
Preventive Care 40-57 Years Old, Female Preventive care refers to lifestyle choices and visits with your health care provider that can promote health and wellness. This includes: A yearly physical exam. This is also called an annual wellness visit. Regular dental and eye exams. Immunizations. Screening for certain conditions. Healthy lifestyle choices, such as: Eating a healthy diet. Getting regular exercise. Not using drugs or products that contain nicotine and tobacco. Limiting alcohol use. What can I expect for my preventive care visit? Physical exam Your health care provider will check your: Height and weight. These may be used to calculate your BMI (body mass index). BMI is a measurement that tells if you are at a healthy weight. Heart rate and blood pressure. Body temperature. Skin for abnormal spots. Counseling Your health care provider may ask you questions about your: Past medical problems. Family's medical history. Alcohol, tobacco, and drug use. Emotional well-being. Home life and relationship well-being. Sexual activity. Diet, exercise, and sleep habits. Work and work environment. Access to firearms. Method of birth control. Menstrual cycle. Pregnancy history. What immunizations do I need? Vaccines are usually given at various ages, according to a schedule. Your health care provider will recommend vaccines for you based on your age, medical history, and lifestyle or other factors, such as travel or where you work. What tests do I need? Blood tests Lipid and cholesterol levels. These may be checked every 5 years, or more often if you are over 57 years old. Hepatitis C test. Hepatitis B test. Screening Lung cancer screening. You may have this screening every year starting at age 57 if you have a 30-pack-year history of smoking and currently smoke or have quit within the past 15 years. Colorectal cancer screening. All adults should have this screening starting at  age 57 and continuing until age 75. Your health care provider may recommend screening at age 45 if you are at increased risk. You will have tests every 1-10 years, depending on your results and the type of screening test. Diabetes screening. This is done by checking your blood sugar (glucose) after you have not eaten for a while (fasting). You may have this done every 1-3 years. Mammogram. This may be done every 1-2 years. Talk with your health care provider about when you should start having regular mammograms. This may depend on whether you have a family history of breast cancer. BRCA-related cancer screening. This may be done if you have a family history of breast, ovarian, tubal, or peritoneal cancers. Pelvic exam and Pap test. This may be done every 3 years starting at age 21. Starting at age 30, this may be done every 5 years if you have a Pap test in combination with an HPV test. Other tests STD (sexually transmitted disease) testing, if you are at risk. Bone density scan. This is done to screen for osteoporosis. You may have this scan if you are at high risk for osteoporosis. Talk with your health care provider about your test results, treatment options, and if necessary, the need for more tests. Follow these instructions at home: Eating and drinking  Eat a diet that includes fresh fruits and vegetables, whole grains, lean protein, and low-fat dairy products. Take vitamin and mineral supplements as recommended by your health care provider. Do not drink alcohol if: Your health care provider tells you not to drink. You are pregnant, may be pregnant, or are planning to become pregnant. If you drink alcohol: Limit how much you have to 0-1 drink a day. Be   aware of how much alcohol is in your drink. In the [place] one drink equals one 12 oz bottle of beer (355 mL), one 5 oz glass of wine (148 mL), or one 1 oz glass of hard liquor (44 mL). Lifestyle Take daily care of your teeth and  gums. Brush your teeth every morning and night with fluoride toothpaste. Floss one time each day. Stay active. Exercise for at least 30 minutes 5 or more days each week. Do not use any products that contain nicotine or tobacco, such as cigarettes, e-cigarettes, and chewing tobacco. If you need help quitting, ask your health care provider. Do not use drugs. If you are sexually active, practice safe sex. Use a condom or other form of protection to prevent STIs (sexually transmitted infections). If you do not wish to become pregnant, use a form of birth control. If you plan to become pregnant, see your health care provider for a prepregnancy visit. If told by your health care provider, take low-dose aspirin daily starting at age 57. Find healthy ways to cope with stress, such as: Meditation, yoga, or listening to music. Journaling. Talking to a trusted person. Spending time with friends and family. Safety Always wear your seat belt while driving or riding in a vehicle. Do not drive: If you have been drinking alcohol. Do not ride with someone who has been drinking. When you are tired or distracted. While texting. Wear a helmet and other protective equipment during sports activities. If you have firearms in your house, make sure you follow all gun safety procedures. What's next? Visit your health care provider once a year for an annual wellness visit. Ask your health care provider how often you should have your eyes and teeth checked. Stay up to date on all vaccines. This information is not intended to replace advice given to you by your health care provider. Make sure you discuss any questions you have with your health care provider. Document Revised: 06/06/2020 Document Reviewed: 12/08/2017 Elsevier Patient Education  2022 [employer].

## 2021-01-05 NOTE — Progress Notes (Addendum)
Subjective:   By signing my name below, I, [name], attest that this documentation has been prepared under the direction and in the presence of Dr. Seabron Spates, DO. 01/05/2021      Patient ID: [name], female    DOB: 29-Nov-1965, 55 y.o.   MRN: [id number]  Chief Complaint  Patient presents with   Annual Exam    Pt states eating breakfast this morning     HPI Patient is in today for a office visit. She is requesting a refill on 100 mg synthroid daily PO.  She reports having a new mole above her left knee. It is not bothering her at this time.  She denies having any fever, ear pain, congestion, sinus pain, sore throat, eye pain, chest pain, palpations, cough, SOB, wheezing, n/v/d, constipation, blood in stool, dysuria, frequency, hematuria, or headaches at this time. She has no recent changes to her family history. She has no recent surgical procedures.  She occasionally drinks 2 glasses of wine on the weekend. She does not use drugs. She has one sexual partner. She does not use tobacco products. She does not use vaping products.  She has received the flu vaccine during this visit. She is due for the tetanus vaccine and is willing to get it during this visit. She has 3 Covid-19 vaccines at this time. She is interested in getting the new Covid-19 booster vaccine She participates in exercise by walking and using a exercise bike at home.  She is UTD on vision care and dental care.  She continues seeing a GYN specialist.    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   Procedure: RIGHT TOTAL HIP ARTHROPLASTY ANTERIOR APPROACH;  Surgeon: Kathryne Hitch, MD;  Location: [facility];  Service: Orthopedics;  Laterality: Right;    Family History  Problem  Relation Age of Onset   Diabetes Paternal Grandfather    Diabetes Father    Throat cancer Father    Heart disease Father        Heart transplant   Hypertension Mother    Breast cancer Other        Aunt   Breast cancer Maternal Aunt        diagnosed in her 75's    Social History   Socioeconomic History   Marital status: Married    Spouse name: Not on file   Number of children: Not on file   Years of education: Not on file   Highest education level: Not on file  Occupational History   Not on file  Tobacco Use   Smoking status: Never   Smokeless tobacco: Never  Vaping Use   Vaping Use: Never used  Substance and Sexual Activity   Alcohol use: Yes    Alcohol/week: 2.0 standard drinks    Types: 2 Glasses of wine per week    Comment: Moderate   Drug use: No   Sexual activity: Yes    Partners: Female  Other Topics Concern   Not on file  Social History Narrative   Walking and using bike    Lives with husband    3 children all out of the house---- 2 grand babies "twins"   Social Determinants of Health   Financial  Resource Strain: Not on file  Food Insecurity: Not on file  Transportation Needs: Not on file  Physical Activity: Not on file  Stress: Not on file  Social Connections: Not on file  Intimate Partner Violence: Not on file    Outpatient Medications Prior to Visit  Medication Sig Dispense Refill   fluticasone (FLONASE) 50 MCG/ACT nasal spray Place 2 sprays into both nostrils daily. 16 g 6   SYNTHROID 100 MCG tablet TAKE 1 TABLET DAILY BEFORE BREAKFAST 30 tablet 0   No facility-administered medications prior to visit.    No Known Allergies  Review of Systems  Constitutional:  Negative for fever.       (-)unexpected weight change (-)Adenopathy  HENT:  Negative for congestion, ear pain, hearing loss, sinus pain and sore throat.        (-)Rhinorrhea   Eyes:  Negative for pain.       (-)Visual disturbance  Respiratory:  Negative for cough, shortness of  breath and wheezing.   Cardiovascular:  Negative for chest pain, palpitations and leg swelling.  Gastrointestinal:  Negative for blood in stool, constipation, diarrhea, nausea and vomiting.  Genitourinary:  Negative for dysuria, frequency and hematuria.  Musculoskeletal:  Negative for joint pain and myalgias.  Skin:  Negative for rash.       (+)Mole above left knee  Neurological:  Negative for headaches.  Psychiatric/Behavioral:  Negative for depression. The patient is not nervous/anxious.       Objective:    Physical Exam Constitutional:      General: She is not in acute distress.    Appearance: Normal appearance. She is not ill-appearing.  HENT:     Head: Normocephalic and atraumatic.     Right Ear: Tympanic membrane, ear canal and external ear normal.     Left Ear: Tympanic membrane, ear canal and external ear normal.  Eyes:     Extraocular Movements: Extraocular movements intact.     Pupils: Pupils are equal, round, and reactive to light.  Cardiovascular:     Rate and Rhythm: Normal rate and regular rhythm.     Heart sounds: Normal heart sounds. No murmur heard.   No gallop.  Pulmonary:     Effort: Pulmonary effort is normal. No respiratory distress.     Breath sounds: Normal breath sounds. No wheezing or rales.  Abdominal:     General: Bowel sounds are normal. There is no distension.     Palpations: Abdomen is soft.     Tenderness: There is no abdominal tenderness. There is no guarding.  Skin:    General: Skin is warm and dry.     Findings: Lesion present.          Comments: Round lesion -- slightly raised  Flesh colored   Neurological:     Mental Status: She is alert and oriented to person, place, and time.  Psychiatric:        Behavior: Behavior normal.    BP 110/80 (BP Location: Left Arm, Patient Position: Sitting, Cuff Size: Normal)   Pulse 66   Temp 98.1 F (36.7 C) (Oral)   Resp 18   Ht 5\' 4"  (1.626 m)   Wt 184 lb 3.2 oz (83.6 kg)   LMP 05/05/2016    SpO2 98%   BMI 31.62 kg/m  Wt Readings from Last 3 Encounters:  01/05/21 184 lb 3.2 oz (83.6 kg)  11/23/19 180 lb 6.4 oz (81.8 kg)  10/31/18 183 lb 3.2 oz (83.1 kg)  Diabetic Foot Exam - Simple   No data filed    Lab Results  Component Value Date   WBC 8.1 11/23/2019   HGB 13.4 11/23/2019   HCT 40.3 11/23/2019   PLT 260 11/23/2019   GLUCOSE 78 11/23/2019   CHOL 233 (H) 11/23/2019   TRIG 138 11/23/2019   HDL 74 11/23/2019   LDLDIRECT 159.6 10/19/2012   LDLCALC 133 (H) 11/23/2019   ALT 14 11/23/2019   AST 16 11/23/2019   NA 138 11/23/2019   K 4.5 11/23/2019   CL 102 11/23/2019   CREATININE 0.78 11/23/2019   BUN 23 11/23/2019   CO2 25 11/23/2019   TSH 0.77 11/23/2019   INR 1.00 06/25/2013    Lab Results  Component Value Date   TSH 0.77 11/23/2019   Lab Results  Component Value Date   WBC 8.1 11/23/2019   HGB 13.4 11/23/2019   HCT 40.3 11/23/2019   MCV 90.6 11/23/2019   PLT 260 11/23/2019   Lab Results  Component Value Date   NA 138 11/23/2019   K 4.5 11/23/2019   CO2 25 11/23/2019   GLUCOSE 78 11/23/2019   BUN 23 11/23/2019   CREATININE 0.78 11/23/2019   BILITOT 0.5 11/23/2019   ALKPHOS 98 10/31/2018   AST 16 11/23/2019   ALT 14 11/23/2019   PROT 6.8 11/23/2019   ALBUMIN 4.6 10/31/2018   CALCIUM 9.6 11/23/2019   GFR 88.18 10/31/2018   Lab Results  Component Value Date   CHOL 233 (H) 11/23/2019   Lab Results  Component Value Date   HDL 74 11/23/2019   Lab Results  Component Value Date   LDLCALC 133 (H) 11/23/2019   Lab Results  Component Value Date   TRIG 138 11/23/2019   Lab Results  Component Value Date   CHOLHDL 3.1 11/23/2019   No results found for: HGBA1C  Mammogram- Last completed 03/10/2020. Results are normal. Repeat in 1 year.  Colonoscopy- Last completed 03/01/2014. Results are normal. Repeat in 10 years.  Pap Smear- Last completed 11/10/2017. Results are normal. Repeat in 3 years. Due.      Assessment & Plan:    Problem List Items Addressed This Visit       Unprioritized   Preventative health care - Primary    ghm utd Check labs       Relevant Orders   CBC with Differential/Platelet   TSH   Comprehensive metabolic panel   Lipid panel   Other Visit Diagnoses     Need for influenza vaccination       Relevant Orders   Flu Vaccine QUAD 45mo+IM (Fluarix, Fluzone & Alfiuria Quad PF) (Completed)   Need for tetanus booster       Relevant Orders   Tdap vaccine greater than or equal to 7yo IM (Completed)        No orders of the defined types were placed in this encounter.   I, Dr. Seabron Spates, DO, personally preformed the services described in this documentation.  All medical record entries made by the scribe were at my direction and in my presence.  I have reviewed the chart and discharge instructions (if applicable) and agree that the record reflects my personal performance and is accurate and complete. 01/05/2021   I,[name],acting as a scribe for Donato Schultz, DO.,have documented all relevant documentation on the behalf of Donato Schultz, DO,as directed by  Donato Schultz, DO while in the presence of Donato Schultz,  DO.   Ann Held, DO

## 2021-01-06 LAB — CBC WITH DIFFERENTIAL/PLATELET
Basophils Absolute: 0.1 10*3/uL (ref 0.0–0.1)
Basophils Relative: 0.9 % (ref 0.0–3.0)
Eosinophils Absolute: 0.1 10*3/uL (ref 0.0–0.7)
Eosinophils Relative: 1.7 % (ref 0.0–5.0)
HCT: 40.7 % (ref 36.0–46.0)
Hemoglobin: 13.5 g/dL (ref 12.0–15.0)
Lymphocytes Relative: 36.6 % (ref 12.0–46.0)
Lymphs Abs: 2.6 10*3/uL (ref 0.7–4.0)
MCHC: 33.1 g/dL (ref 30.0–36.0)
MCV: 91 fl (ref 78.0–100.0)
Monocytes Absolute: 0.4 10*3/uL (ref 0.1–1.0)
Monocytes Relative: 6.3 % (ref 3.0–12.0)
Neutro Abs: 3.8 10*3/uL (ref 1.4–7.7)
Neutrophils Relative %: 54.5 % (ref 43.0–77.0)
Platelets: 261 10*3/uL (ref 150.0–400.0)
RBC: 4.48 Mil/uL (ref 3.87–5.11)
RDW: 13.2 % (ref 11.5–15.5)
WBC: 7 10*3/uL (ref 4.0–10.5)

## 2021-01-06 LAB — COMPREHENSIVE METABOLIC PANEL
ALT: 13 U/L (ref 0–35)
AST: 16 U/L (ref 0–37)
Albumin: 4.5 g/dL (ref 3.5–5.2)
Alkaline Phosphatase: 91 U/L (ref 39–117)
BUN: 19 mg/dL (ref 6–23)
CO2: 27 mEq/L (ref 19–32)
Calcium: 9.5 mg/dL (ref 8.4–10.5)
Chloride: 103 mEq/L (ref 96–112)
Creatinine, Ser: 0.64 mg/dL (ref 0.40–1.20)
GFR: 97.94 mL/min (ref >60.00)
Glucose, Bld: 84 mg/dL (ref 70–99)
Potassium: 4 mEq/L (ref 3.5–5.1)
Sodium: 139 mEq/L (ref 135–145)
Total Bilirubin: 0.5 mg/dL (ref 0.2–1.2)
Total Protein: 7.2 g/dL (ref 6.0–8.3)

## 2021-01-06 LAB — LIPID PANEL
Cholesterol: 227 mg/dL — ABNORMAL HIGH (ref 0–200)
HDL: 72.4 mg/dL (ref >39.00)
LDL Cholesterol: 116 mg/dL — ABNORMAL HIGH (ref 0–99)
NonHDL: 154.45
Total CHOL/HDL Ratio: 3
Triglycerides: 194 mg/dL — ABNORMAL HIGH (ref 0.0–149.0)
VLDL: 38.8 mg/dL (ref 0.0–40.0)

## 2021-01-06 LAB — TSH: TSH: 0.63 u[IU]/mL (ref 0.35–5.50)

## 2021-01-08 ENCOUNTER — Other Ambulatory Visit: Payer: Self-pay | Admitting: Family Medicine

## 2021-01-08 DIAGNOSIS — E785 Hyperlipidemia, unspecified: Secondary | ICD-10-CM

## 2021-01-12 ENCOUNTER — Other Ambulatory Visit: Payer: Self-pay | Admitting: Nurse Practitioner

## 2021-01-12 DIAGNOSIS — Z1231 Encounter for screening mammogram for malignant neoplasm of breast: Secondary | ICD-10-CM

## 2021-01-18 ENCOUNTER — Other Ambulatory Visit: Payer: Self-pay | Admitting: Family Medicine

## 2021-03-11 ENCOUNTER — Ambulatory Visit
Admission: RE | Admit: 2021-03-11 | Discharge: 2021-03-11 | Disposition: A | Discharge status: Home / Self Care | Payer: BC Managed Care – PPO | Source: Ambulatory Visit | Attending: Nurse Practitioner | Admitting: Nurse Practitioner

## 2021-03-11 DIAGNOSIS — Z1231 Encounter for screening mammogram for malignant neoplasm of breast: Secondary | ICD-10-CM

## 2021-03-31 ENCOUNTER — Other Ambulatory Visit: Payer: Self-pay | Admitting: Family Medicine

## 2021-08-24 ENCOUNTER — Encounter: Payer: Self-pay | Admitting: Family Medicine

## 2021-08-24 DIAGNOSIS — E039 Hypothyroidism, unspecified: Secondary | ICD-10-CM

## 2022-01-06 ENCOUNTER — Other Ambulatory Visit: Payer: Self-pay | Admitting: Family Medicine

## 2022-01-08 ENCOUNTER — Encounter: Payer: Self-pay | Admitting: Family Medicine

## 2022-01-08 ENCOUNTER — Ambulatory Visit (INDEPENDENT_AMBULATORY_CARE_PROVIDER_SITE_OTHER): Payer: BC Managed Care – PPO | Admitting: Family Medicine

## 2022-01-08 VITALS — BP 112/70 | HR 67 | Temp 98.2°F | Resp 18 | Ht 64.0 in | Wt 185.8 lb

## 2022-01-08 DIAGNOSIS — E039 Hypothyroidism, unspecified: Secondary | ICD-10-CM | POA: Diagnosis not present

## 2022-01-08 DIAGNOSIS — Z0000 Encounter for general adult medical examination without abnormal findings: Secondary | ICD-10-CM

## 2022-01-08 DIAGNOSIS — Z23 Encounter for immunization: Secondary | ICD-10-CM

## 2022-01-08 LAB — LIPID PANEL
Cholesterol: 224 mg/dL — ABNORMAL HIGH (ref 0–200)
HDL: 77.8 mg/dL (ref >39.00)
LDL Cholesterol: 126 mg/dL — ABNORMAL HIGH (ref 0–99)
NonHDL: 145.89
Total CHOL/HDL Ratio: 3
Triglycerides: 100 mg/dL (ref 0.0–149.0)
VLDL: 20 mg/dL (ref 0.0–40.0)

## 2022-01-08 LAB — COMPREHENSIVE METABOLIC PANEL
ALT: 14 U/L (ref 0–35)
AST: 15 U/L (ref 0–37)
Albumin: 4.6 g/dL (ref 3.5–5.2)
Alkaline Phosphatase: 85 U/L (ref 39–117)
BUN: 19 mg/dL (ref 6–23)
CO2: 28 mEq/L (ref 19–32)
Calcium: 9.6 mg/dL (ref 8.4–10.5)
Chloride: 104 mEq/L (ref 96–112)
Creatinine, Ser: 0.71 mg/dL (ref 0.40–1.20)
GFR: 93.57 mL/min (ref >60.00)
Glucose, Bld: 103 mg/dL — ABNORMAL HIGH (ref 70–99)
Potassium: 4.5 mEq/L (ref 3.5–5.1)
Sodium: 141 mEq/L (ref 135–145)
Total Bilirubin: 0.7 mg/dL (ref 0.2–1.2)
Total Protein: 7.2 g/dL (ref 6.0–8.3)

## 2022-01-08 LAB — CBC WITH DIFFERENTIAL/PLATELET
Basophils Absolute: 0.1 10*3/uL (ref 0.0–0.1)
Basophils Relative: 1 % (ref 0.0–3.0)
Eosinophils Absolute: 0.1 10*3/uL (ref 0.0–0.7)
Eosinophils Relative: 1.6 % (ref 0.0–5.0)
HCT: 41.1 % (ref 36.0–46.0)
Hemoglobin: 13.8 g/dL (ref 12.0–15.0)
Lymphocytes Relative: 29.7 % (ref 12.0–46.0)
Lymphs Abs: 1.6 10*3/uL (ref 0.7–4.0)
MCHC: 33.5 g/dL (ref 30.0–36.0)
MCV: 90.7 fl (ref 78.0–100.0)
Monocytes Absolute: 0.4 10*3/uL (ref 0.1–1.0)
Monocytes Relative: 7.3 % (ref 3.0–12.0)
Neutro Abs: 3.2 10*3/uL (ref 1.4–7.7)
Neutrophils Relative %: 60.4 % (ref 43.0–77.0)
Platelets: 246 10*3/uL (ref 150.0–400.0)
RBC: 4.53 Mil/uL (ref 3.87–5.11)
RDW: 12.8 % (ref 11.5–15.5)
WBC: 5.4 10*3/uL (ref 4.0–10.5)

## 2022-01-08 LAB — VITAMIN D 25 HYDROXY (VIT D DEFICIENCY, FRACTURES): VITD: 25.32 ng/mL — ABNORMAL LOW (ref 30.00–100.00)

## 2022-01-08 LAB — VITAMIN B12: Vitamin B-12: 321 pg/mL (ref 211–911)

## 2022-01-08 NOTE — Patient Instructions (Signed)
Preventive Care 40-58 Years Old, Female Preventive care refers to lifestyle choices and visits with your health care provider that can promote health and wellness. Preventive care visits are also called wellness exams. What can I expect for my preventive care visit? Counseling Your health care provider may ask you questions about your: Medical history, including: Past medical problems. Family medical history. Pregnancy history. Current health, including: Menstrual cycle. Method of birth control. Emotional well-being. Home life and relationship well-being. Sexual activity and sexual health. Lifestyle, including: Alcohol, nicotine or tobacco, and drug use. Access to firearms. Diet, exercise, and sleep habits. Work and work environment. Sunscreen use. Safety issues such as seatbelt and bike helmet use. Physical exam Your health care provider will check your: Height and weight. These may be used to calculate your BMI (body mass index). BMI is a measurement that tells if you are at a healthy weight. Waist circumference. This measures the distance around your waistline. This measurement also tells if you are at a healthy weight and may help predict your risk of certain diseases, such as type 2 diabetes and high blood pressure. Heart rate and blood pressure. Body temperature. Skin for abnormal spots. What immunizations do I need?  Vaccines are usually given at various ages, according to a schedule. Your health care provider will recommend vaccines for you based on your age, medical history, and lifestyle or other factors, such as travel or where you work. What tests do I need? Screening Your health care provider may recommend screening tests for certain conditions. This may include: Lipid and cholesterol levels. Diabetes screening. This is done by checking your blood sugar (glucose) after you have not eaten for a while (fasting). Pelvic exam and Pap test. Hepatitis B test. Hepatitis C  test. HIV (human immunodeficiency virus) test. STI (sexually transmitted infection) testing, if you are at risk. Lung cancer screening. Colorectal cancer screening. Mammogram. Talk with your health care provider about when you should start having regular mammograms. This may depend on whether you have a family history of breast cancer. BRCA-related cancer screening. This may be done if you have a family history of breast, ovarian, tubal, or peritoneal cancers. Bone density scan. This is done to screen for osteoporosis. Talk with your health care provider about your test results, treatment options, and if necessary, the need for more tests. Follow these instructions at home: Eating and drinking  Eat a diet that includes fresh fruits and vegetables, whole grains, lean protein, and low-fat dairy products. Take vitamin and mineral supplements as recommended by your health care provider. Do not drink alcohol if: Your health care provider tells you not to drink. You are pregnant, may be pregnant, or are planning to become pregnant. If you drink alcohol: Limit how much you have to 0-1 drink a day. Know how much alcohol is in your drink. In the [place] one drink equals one 12 oz bottle of beer (355 mL), one 5 oz glass of wine (148 mL), or one 1 oz glass of hard liquor (44 mL). Lifestyle Brush your teeth every morning and night with fluoride toothpaste. Floss one time each day. Exercise for at least 30 minutes 5 or more days each week. Do not use any products that contain nicotine or tobacco. These products include cigarettes, chewing tobacco, and vaping devices, such as e-cigarettes. If you need help quitting, ask your health care provider. Do not use drugs. If you are sexually active, practice safe sex. Use a condom or other form of protection to   prevent STIs. If you do not wish to become pregnant, use a form of birth control. If you plan to become pregnant, see your health care provider for a  prepregnancy visit. Take aspirin only as told by your health care provider. Make sure that you understand how much to take and what form to take. Work with your health care provider to find out whether it is safe and beneficial for you to take aspirin daily. Find healthy ways to manage stress, such as: Meditation, yoga, or listening to music. Journaling. Talking to a trusted person. Spending time with friends and family. Minimize exposure to UV radiation to reduce your risk of skin cancer. Safety Always wear your seat belt while driving or riding in a vehicle. Do not drive: If you have been drinking alcohol. Do not ride with someone who has been drinking. When you are tired or distracted. While texting. If you have been using any mind-altering substances or drugs. Wear a helmet and other protective equipment during sports activities. If you have firearms in your house, make sure you follow all gun safety procedures. Seek help if you have been physically or sexually abused. What's next? Visit your health care provider once a year for an annual wellness visit. Ask your health care provider how often you should have your eyes and teeth checked. Stay up to date on all vaccines. This information is not intended to replace advice given to you by your health care provider. Make sure you discuss any questions you have with your health care provider. Document Revised: 09/24/2020 Document Reviewed: 09/24/2020 Elsevier Patient Education  [employer].

## 2022-01-08 NOTE — Progress Notes (Signed)
Subjective:     [name] is a 56 y.o. female and is here for a comprehensive physical exam. The patient reports no problems.  Social History   Socioeconomic History   Marital status: Married    Spouse name: Not on file   Number of children: Not on file   Years of education: Not on file   Highest education level: Not on file  Occupational History   Not on file  Tobacco Use   Smoking status: Never   Smokeless tobacco: Never  Vaping Use   Vaping Use: Never used  Substance and Sexual Activity   Alcohol use: Yes    Alcohol/week: 2.0 standard drinks of alcohol    Types: 2 Glasses of wine per week    Comment: Moderate   Drug use: No   Sexual activity: Yes    Partners: Female  Other Topics Concern   Not on file  Social History Narrative   Walking and using bike    Lives with husband    3 children all out of the house---- 2 grand babies "twins"   Social Determinants of Health   Financial Resource Strain: Not on file  Food Insecurity: Not on file  Transportation Needs: Not on file  Physical Activity: Not on file  Stress: Not on file  Social Connections: Not on file  Intimate Partner Violence: Not on file   Health Maintenance  Topic Date Due   COVID-19 Vaccine (4 - Pfizer series) 03/20/2020   PAP SMEAR-Modifier  11/10/2020   INFLUENZA VACCINE  11/10/2021   MAMMOGRAM  03/11/2022   COLONOSCOPY (Pts 45-68yrs Insurance coverage will need to be confirmed)  03/01/2024   TETANUS/TDAP  01/06/2031   Hepatitis C Screening  Completed   HIV Screening  Completed   Zoster Vaccines- Shingrix  Completed   HPV VACCINES  Aged Out    The following portions of the patient's history were reviewed and updated as appropriate: She  has a past medical history of Arthritis, Family history of anesthesia complication, and Hypothyroid. She does not have any pertinent problems on file. She  has a past surgical history that includes No past surgeries and Total hip arthroplasty (Right,  06/29/2013). Her family history includes Breast cancer in her maternal aunt and another family member; Diabetes in her father and paternal grandfather; Heart disease in her father; Hypertension in her mother; Throat cancer in her father. She  reports that she has never smoked. She has never used smokeless tobacco. She reports current alcohol use of about 2.0 standard drinks of alcohol per week. She reports that she does not use drugs. She has a current medication list which includes the following prescription(s): fluticasone and synthroid. Current Outpatient Medications on File Prior to Visit  Medication Sig Dispense Refill   fluticasone (FLONASE) 50 MCG/ACT nasal spray Place 2 sprays into both nostrils daily. 16 g 6   SYNTHROID 100 MCG tablet TAKE 1 TABLET DAILY BEFORE BREAKFAST 90 tablet 2   No current facility-administered medications on file prior to visit.   She has No Known Allergies..  Review of Systems   Objective:  Review of Systems  Constitutional: Negative for activity change, appetite change and fatigue.  HENT: Negative for hearing loss, congestion, tinnitus and ear discharge.  dentist q29m Eyes: Negative for visual disturbance (see optho q1y -- vision corrected to 20/20 with glasses).  Respiratory: Negative for cough, chest tightness and shortness of breath.   Cardiovascular: Negative for chest pain, palpitations and leg swelling.  Gastrointestinal:  Negative for abdominal pain, diarrhea, constipation and abdominal distention.  Genitourinary: Negative for urgency, frequency, decreased urine volume and difficulty urinating.  Musculoskeletal: Negative for back pain, arthralgias and gait problem.  Skin: Negative for color change, pallor and rash.  Neurological: Negative for dizziness, light-headedness, numbness and headaches.  Hematological: Negative for adenopathy. Does not bruise/bleed easily.  Psychiatric/Behavioral: Negative for suicidal ideas, confusion, sleep disturbance,  self-injury, dysphoric mood, decreased concentration and agitation.      BP 112/70 (BP Location: Left Arm, Patient Position: Sitting, Cuff Size: Normal)   Pulse 67   Temp 98.2 F (36.8 C) (Oral)   Resp 18   Ht 5\' 4"  (1.626 m)   Wt 185 lb 12.8 oz (84.3 kg)   LMP 05/05/2016   SpO2 98%   BMI 31.89 kg/m  General appearance: alert, cooperative, appears stated age, and no distress Head: Normocephalic, without obvious abnormality, atraumatic Eyes: conjunctivae/corneas clear. PERRL, EOM's intact. Fundi benign. Ears: normal TM's and external ear canals both ears Nose: Nares normal. Septum midline. Mucosa normal. No drainage or sinus tenderness. Throat: lips, mucosa, and tongue normal; teeth and gums normal Neck: no adenopathy, no carotid bruit, no JVD, supple, symmetrical, trachea midline, and thyroid not enlarged, symmetric, no tenderness/mass/nodules Back: symmetric, no curvature. ROM normal. No CVA tenderness. Lungs: clear to auscultation bilaterally Heart: regular rate and rhythm, S1, S2 normal, no murmur, click, rub or gallop Abdomen: soft, non-tender; bowel sounds normal; no masses,  no organomegaly Pelvic: deferred Extremities: extremities normal, atraumatic, no cyanosis or edema Pulses: 2+ and symmetric Skin: Skin color, texture, turgor normal. No rashes or lesions Lymph nodes: Cervical, supraclavicular, and axillary nodes normal. Neurologic: Alert and oriented X 3, normal strength and tone. Normal symmetric reflexes. Normal coordination and gait    Assessment:    Healthy female exam.      Plan:    Ghm utd Check labs See ave See After Visit Summary for Counseling Recommendations   1. Need for influenza vaccination  - Flu Vaccine QUAD 72mo+IM (Fluarix, Fluzone & Alfiuria Quad PF)  2. Hypothyroidism, unspecified type Check labs  Endo referral pending - Thyroid Panel With TSH - Thyroid peroxidase antibody  3. Preventative health care See above - CBC with  Differential/Platelet - Comprehensive metabolic panel - Lipid panel - Vitamin B12 - VITAMIN D 25 Hydroxy (Vit-D Deficiency, Fractures)

## 2022-01-11 LAB — THYROID PEROXIDASE ANTIBODY: Thyroperoxidase Ab SerPl-aCnc: 635 IU/mL — ABNORMAL HIGH (ref <9)

## 2022-01-11 LAB — THYROID PANEL WITH TSH
Free Thyroxine Index: 2.8 (ref 1.4–3.8)
T3 Uptake: 32 % (ref 22–35)
T4, Total: 8.7 ug/dL (ref 5.1–11.9)
TSH: 0.35 mIU/L — ABNORMAL LOW (ref 0.40–4.50)

## 2022-01-12 ENCOUNTER — Other Ambulatory Visit: Payer: Self-pay | Admitting: Family Medicine

## 2022-01-12 DIAGNOSIS — Z1231 Encounter for screening mammogram for malignant neoplasm of breast: Secondary | ICD-10-CM

## 2022-01-14 ENCOUNTER — Encounter: Payer: Self-pay | Admitting: Family Medicine

## 2022-01-19 ENCOUNTER — Other Ambulatory Visit: Payer: Self-pay | Admitting: Family

## 2022-01-19 DIAGNOSIS — E038 Other specified hypothyroidism: Secondary | ICD-10-CM

## 2022-01-25 ENCOUNTER — Ambulatory Visit: Payer: BC Managed Care – PPO | Admitting: Internal Medicine

## 2022-01-25 ENCOUNTER — Encounter: Payer: Self-pay | Admitting: Internal Medicine

## 2022-01-25 VITALS — BP 120/78 | HR 62 | Ht 64.0 in | Wt 185.6 lb

## 2022-01-25 DIAGNOSIS — E063 Autoimmune thyroiditis: Secondary | ICD-10-CM

## 2022-01-25 MED ORDER — LEVOTHYROXINE SODIUM 88 MCG PO TABS: 88.0000 ug | ORAL_TABLET | Freq: Every day | ORAL | 6 refills | Status: DC

## 2022-01-25 MED ORDER — LIOTHYRONINE SODIUM 5 MCG PO TABS: 5.0000 ug | ORAL_TABLET | Freq: Every day | ORAL | 2 refills | Status: DC

## 2022-01-25 NOTE — Patient Instructions (Addendum)
Stop Synthroid 100 mcg  Start Synthroid 88 mcg  Start Liothyronine 5 mcg daily     You are on levothyroxine - which is your thyroid hormone supplement. You MUST take this consistently.  You should take this first thing in the morning on an empty stomach with water. You should not take it with other medications. Wait 34min to 1hr prior to eating. If you are taking any vitamins - please take these in the evening.   If you miss a dose, please take your missed dose the following day (double the dose for that day). You should have a pill box for ONLY levothyroxine on your bedside table to help you remember to take your medications.

## 2022-01-25 NOTE — Progress Notes (Signed)
Name: [name]  MRN/ DOB: [id number], [birth year]    Age/ Sex: 56 y.o., female    PCP: Zola Button, Grayling Congress, DO   Reason for Endocrinology Evaluation: Hypothyroidism     Date of Initial Endocrinology Evaluation: 01/25/2022     HPI: [name] is a 56 y.o. female with a past medical history of Hypothyroidism. The patient presented for initial endocrinology clinic visit on 01/25/2022 for consultative assistance with her Hypothyroidism.   Pt has been diagnosed with hypothyroidism ~8-10 , she is on LT-4 replacement     Pt with elevated anti-TPO 635 at 01/08/2022  Historically she feels tired Skin is dry and itchy  Has low libido  She sleeps OK, has  lot of on her mind lately  She is unable to lose weight , does not follow with a weight loss clinic  No Biotin No local  neck symptoms  Has more constipation   Synthroid 100 mcg daily     NO FH of thyroid disease   HISTORY:  Past Medical History:  Past Medical History:  Diagnosis Date   Arthritis    Family history of anesthesia complication    mother severe N/V   Hypothyroid    Past Surgical History:  Past Surgical History:  Procedure Laterality Date   NO PAST SURGERIES     TOTAL HIP ARTHROPLASTY Right 06/29/2013   Procedure: RIGHT TOTAL HIP ARTHROPLASTY ANTERIOR APPROACH;  Surgeon: Kathryne Hitch, MD;  Location: [facility];  Service: Orthopedics;  Laterality: Right;    Social History:  reports that she has never smoked. She has never used smokeless tobacco. She reports current alcohol use of about 2.0 standard drinks of alcohol per week. She reports that she does not use drugs. Family History: family history includes Breast cancer in her maternal aunt and another family member; Diabetes in her father and paternal grandfather; Heart disease in her father; Hypertension in her mother; Throat cancer in her father.   HOME MEDICATIONS: Allergies as of 01/25/2022   No Known Allergies      Medication  List        Accurate as of January 25, 2022 11:27 AM. If you have any questions, ask your nurse or doctor.          cholecalciferol 25 MCG (1000 UNIT) tablet Commonly known as: VITAMIN D3 Take 1,000 Units by mouth daily.   Fish Oil 1200 MG Caps Take 1 mg by mouth daily.   fluticasone 50 MCG/ACT nasal spray Commonly known as: FLONASE Place 2 sprays into both nostrils daily.   levothyroxine 88 MCG tablet Commonly known as: Synthroid Take 1 tablet (88 mcg total) by mouth daily before breakfast. What changed:  medication strength how much to take Changed by: Scarlette Shorts, MD   liothyronine 5 MCG tablet Commonly known as: CYTOMEL Take 1 tablet (5 mcg total) by mouth daily. Started by: Scarlette Shorts, MD   RA Flax Seed Oil 1000 MG Caps Take 1 tablet by mouth daily at 6 (six) AM.   SUPER B COMPLEX PO Take 1 tablet by mouth daily at 6 (six) AM.   vitamin C 1000 MG tablet Take 1,000 mg by mouth daily.          REVIEW OF SYSTEMS: A comprehensive ROS was conducted with the patient and is negative except as per HPI and below:  ROS     OBJECTIVE:  VS: BP 120/78 (BP Location: Left Arm, Patient Position: Sitting, Cuff Size: Small)  Pulse 62   Ht 5\' 4"  (1.626 m)   Wt 185 lb 9.6 oz (84.2 kg)   LMP 05/05/2016   SpO2 98%   BMI 31.86 kg/m    Wt Readings from Last 3 Encounters:  01/25/22 185 lb 9.6 oz (84.2 kg)  01/08/22 185 lb 12.8 oz (84.3 kg)  01/05/21 184 lb 3.2 oz (83.6 kg)     EXAM: General: Pt appears well and is in NAD  Eyes: External eye exam normal without stare, lid lag or exophthalmos.  EOM intact.   Neck: General: Supple without adenopathy. Thyroid: Thyroid size normal.  No goiter or nodules appreciated. No thyroid bruit.  Lungs: Clear with good BS bilat with no rales, rhonchi, or wheezes  Heart: Auscultation: RRR.  Abdomen: Normoactive bowel sounds, soft, nontender, without masses or organomegaly palpable  Extremities:  BL LE: No  pretibial edema normal ROM and strength.  Mental Status: Judgment, insight: Intact Orientation: Oriented to time, place, and person Mood and affect: No depression, anxiety, or agitation     DATA REVIEWED:  Latest Reference Range & Units 01/08/22 09:07  TSH 0.40 - 4.50 mIU/L 0.35 (L)  (L): Data is abnormally low    Latest Reference Range & Units 01/08/22 09:07  Thyroperoxidase Ab SerPl-aCnc <9 IU/mL 635 (H)    ASSESSMENT/PLAN/RECOMMENDATIONS:   Hashimoto's Thyroiditis:  -Patient with multiple nonspecific symptoms -I explained to the patient that Hashimoto's Disease is an autoimmune - mediated destruction of the thyroid gland. The usual course of Hashimoto's thyroiditis is the gradual loss of thyroid function. -Her last TSH was low, we discussed the risk of cardiac arrhythmia as well as increased bone resorption with iatrogenic hyperthyroidism -We did discuss LT-3 replacement in addition to LT-for replacement, we did discuss that the literature has been inconclusive regarding the benefit of liothyronine but we can assess clinically, and if is not helpful then she will need to stop it -In the meantime the patient was advised to address each symptom to the best that she can with proper sleep, exercise, proper skin lotion etc.  Medications : Stop Synthroid 100 mcg Start Synthroid 88 mcg daily Start liothyronine 5 mcg daily   She will not have labs today as she just recently had them, will recheck in 2 months  Labs in 4 months    Signed electronically by: Mack Guise, MD  [facility]  [facility] [address] [place], [place] [address] Phone: [phone] FAX: [phone]   CC: Ann Held, DO [address] [place]  [address] Phone: [phone] Fax: [phone]   Return to Endocrinology clinic as below: Future Appointments  Date Time Provider [place]  03/15/2022  7:50 AM GI-[facility]G  MM 2 GI-BCGMM GI-BREAST CE  03/29/2022  8:45 AM [facility] LAB [facility] None  06/02/2022  8:50 AM Malaika Arnall, Melanie Crazier, MD [facility] None  01/10/2023  8:20 AM Ann Held, DO [facility]

## 2022-01-26 ENCOUNTER — Other Ambulatory Visit: Payer: Self-pay

## 2022-01-26 MED ORDER — LEVOTHYROXINE SODIUM 88 MCG PO TABS: 88.0000 ug | ORAL_TABLET | Freq: Every day | ORAL | 2 refills | Status: DC

## 2022-03-15 ENCOUNTER — Ambulatory Visit
Admission: RE | Admit: 2022-03-15 | Discharge: 2022-03-15 | Disposition: A | Discharge status: Home / Self Care | Payer: BC Managed Care – PPO | Source: Ambulatory Visit | Attending: Family Medicine | Admitting: Family Medicine

## 2022-03-15 DIAGNOSIS — Z1231 Encounter for screening mammogram for malignant neoplasm of breast: Secondary | ICD-10-CM

## 2022-03-29 ENCOUNTER — Other Ambulatory Visit (INDEPENDENT_AMBULATORY_CARE_PROVIDER_SITE_OTHER): Payer: BC Managed Care – PPO

## 2022-03-29 DIAGNOSIS — E063 Autoimmune thyroiditis: Secondary | ICD-10-CM

## 2022-03-29 LAB — TSH: TSH: 0.34 u[IU]/mL — ABNORMAL LOW (ref 0.35–5.50)

## 2022-06-02 ENCOUNTER — Encounter: Payer: Self-pay | Admitting: Internal Medicine

## 2022-06-02 ENCOUNTER — Ambulatory Visit: Payer: BC Managed Care – PPO | Admitting: Internal Medicine

## 2022-06-02 VITALS — BP 110/70 | HR 53 | Ht 64.0 in | Wt 190.0 lb

## 2022-06-02 DIAGNOSIS — E063 Autoimmune thyroiditis: Secondary | ICD-10-CM

## 2022-06-02 LAB — TSH: TSH: 1.55 u[IU]/mL (ref 0.35–5.50)

## 2022-06-02 MED ORDER — LIOTHYRONINE SODIUM 5 MCG PO TABS: 5.0000 ug | ORAL_TABLET | Freq: Every day | ORAL | 2 refills | Status: DC

## 2022-06-02 MED ORDER — LEVOTHYROXINE SODIUM 75 MCG PO TABS: 75.0000 ug | ORAL_TABLET | Freq: Every day | ORAL | 3 refills | Status: DC

## 2022-06-02 MED ORDER — SYNTHROID 75 MCG PO TABS: 75.0000 ug | ORAL_TABLET | Freq: Every day | ORAL | 3 refills | Status: DC

## 2022-06-02 NOTE — Progress Notes (Signed)
Name: [name]  MRN/ DOB: [id number], 08/16/1965    Age/ Sex: 57 y.o., female    PCP: Carollee Herter, Alferd Apa, DO   Reason for Endocrinology Evaluation: Hypothyroidism     Date of Initial Endocrinology Evaluation: 01/25/2022    HPI: [name] is a 57 y.o. female with a past medical history of Hypothyroidism. The patient presented for initial endocrinology clinic visit on 01/25/2022 for consultative assistance with her Hypothyroidism.   Pt has been diagnosed with hypothyroidism ~8-10 yrs ago , she has been on  LT-4 replacement     Pt with elevated anti-TPO 635 at 01/08/2022  NO FH of thyroid disease   On her initial visit to our clinic she was on levothyroxine 100 mcg daily, with a TSH of 0.35 u IU/mL.  Due to chronic and multiple symptoms we opted to try liothyronine and will reduce levothyroxine   SUBJECTIVE:    Today (06/02/22):  [name] is here for follow-up on Hashimoto's thyroiditis.  Weight continues to increase  Skin continues to be dry  Wakes up in the middle of the night feeling refreshed, patient goes back to sleep and wakes up tired in the morning, husband wakes up early to go to work and this may interrupt sleep for her   No local  neck symptoms  Denies constipation  Energy minimal better   Synthroid 88 mcg daily except Sundays Liothyronine 5 mcg daily    HISTORY:  Past Medical History:  Past Medical History:  Diagnosis Date   Arthritis    Family history of anesthesia complication    mother severe N/V   Hypothyroid    Past Surgical History:  Past Surgical History:  Procedure Laterality Date   NO PAST SURGERIES     TOTAL HIP ARTHROPLASTY Right 06/29/2013   Procedure: RIGHT TOTAL HIP ARTHROPLASTY ANTERIOR APPROACH;  Surgeon: Mcarthur Rossetti, MD;  Location: [facility];  Service: Orthopedics;  Laterality: Right;    Social History:  reports that she has never smoked. She has never used smokeless tobacco. She reports current  alcohol use of about 2.0 standard drinks of alcohol per week. She reports that she does not use drugs. Family History: family history includes Breast cancer in her maternal aunt and another family member; Diabetes in her father and paternal grandfather; Heart disease in her father; Hypertension in her mother; Throat cancer in her father.   HOME MEDICATIONS: Allergies as of 06/02/2022   No Known Allergies      Medication List        Accurate as of June 02, 2022  8:57 AM. If you have any questions, ask your nurse or doctor.          cholecalciferol 25 MCG (1000 UNIT) tablet Commonly known as: VITAMIN D3 Take 1,000 Units by mouth daily.   Fish Oil 1200 MG Caps Take 1 mg by mouth daily.   fluticasone 50 MCG/ACT nasal spray Commonly known as: FLONASE Place 2 sprays into both nostrils daily.   levothyroxine 88 MCG tablet Commonly known as: Synthroid Take 1 tablet (88 mcg total) by mouth daily before breakfast.   liothyronine 5 MCG tablet Commonly known as: CYTOMEL Take 1 tablet (5 mcg total) by mouth daily.   RA Flax Seed Oil 1000 MG Caps Take 1 tablet by mouth daily at 6 (six) AM.   SUPER B COMPLEX PO Take 1 tablet by mouth daily at 6 (six) AM.   vitamin C 1000 MG tablet Take 1,000 mg  by mouth daily.          REVIEW OF SYSTEMS: A comprehensive ROS was conducted with the patient and is negative except as per HPI    OBJECTIVE:  VS: BP 110/70 (BP Location: Left Arm, Patient Position: Sitting, Cuff Size: Large)   Pulse (!) 53   Ht 5' 4"$  (1.626 m)   Wt 190 lb (86.2 kg)   LMP 05/05/2016   SpO2 97%   BMI 32.61 kg/m    Wt Readings from Last 3 Encounters:  06/02/22 190 lb (86.2 kg)  01/25/22 185 lb 9.6 oz (84.2 kg)  01/08/22 185 lb 12.8 oz (84.3 kg)     EXAM: General: Pt appears well and is in NAD  Eyes: External eye exam normal without stare, lid lag or exophthalmos.  EOM intact.   Neck: General: Supple without adenopathy. Thyroid: Thyroid size  normal.  No goiter or nodules appreciated. No thyroid bruit.  Lungs: Clear with good BS bilat with no rales, rhonchi, or wheezes  Heart: Auscultation: RRR.  Abdomen: Normoactive bowel sounds, soft, nontender, without masses or organomegaly palpable  Extremities:  BL LE: No pretibial edema normal ROM and strength.  Mental Status: Judgment, insight: Intact Orientation: Oriented to time, place, and person Mood and affect: No depression, anxiety, or agitation     DATA REVIEWED:     Latest Reference Range & Units 06/02/22 09:12  TSH 0.35 - 5.50 uIU/mL 1.55    Latest Reference Range & Units 01/08/22 09:07  Thyroperoxidase Ab SerPl-aCnc <9 IU/mL 635 (H)    ASSESSMENT/PLAN/RECOMMENDATIONS:   Hashimoto's Thyroiditis:  -Slight improvement in energy -TSH has normalized, we will switch Synthroid 88 mcg 6 days a week, to 75 mcg daily from now on -She understands to hold biotin 2-3 days prior to future thyroid lab work  -Medications :  Stop Synthroid 88 mcg Start Synthroid 75 mcg daily Continue liothyronine 5 mcg daily  Labs in 6 months    Signed electronically by: Mack Guise, MD  [facility] End[facility]in[facility]  [facility] [address] [place], [place] [address] Phone: [phone] FAX: [phone]   CC: Ann Held, DO [address] [place] [address] Phone: [phone] Fax: [phone]   Return to Endocrinology clinic as below: Future Appointments  Date Time Provider [place]  01/10/2023  8:20 AM Ann Held, DO [facility]

## 2022-06-11 ENCOUNTER — Ambulatory Visit: Payer: BC Managed Care – PPO | Admitting: Internal Medicine

## 2022-06-29 ENCOUNTER — Telehealth: Payer: BC Managed Care – PPO | Admitting: Physician Assistant

## 2022-06-29 DIAGNOSIS — J029 Acute pharyngitis, unspecified: Secondary | ICD-10-CM | POA: Diagnosis not present

## 2022-06-29 NOTE — Patient Instructions (Signed)
  [name], thank you for joining [place], PA-C for today's virtual visit.  While this provider is not your primary care provider (PCP), if your PCP is located in our provider database this encounter information will be shared with them immediately following your visit.   [name] account gives you access to today's visit and all your visits, tests, and labs performed at [facility] " click here if you don't have a [place] account or go to [email or web address]  Consent: (Patient) [name] provided verbal consent for this virtual visit at the beginning of the encounter.  Current Medications:  Current Outpatient Medications:    Ascorbic Acid (VITAMIN C) 1000 MG tablet, Take 1,000 mg by mouth daily., Disp: , Rfl:    B Complex-C (SUPER B COMPLEX PO), Take 1 tablet by mouth daily at 6 (six) AM., Disp: , Rfl:    cholecalciferol (VITAMIN D3) 25 MCG (1000 UNIT) tablet, Take 1,000 Units by mouth daily., Disp: , Rfl:    Flaxseed, Linseed, (RA FLAX SEED OIL) 1000 MG CAPS, Take 1 tablet by mouth daily at 6 (six) AM., Disp: , Rfl:    fluticasone (FLONASE) 50 MCG/ACT nasal spray, Place 2 sprays into both nostrils daily., Disp: 16 g, Rfl: 6   liothyronine (CYTOMEL) 5 MCG tablet, Take 1 tablet (5 mcg total) by mouth daily., Disp: 90 tablet, Rfl: 2   Omega-3 Fatty Acids (FISH OIL) 1200 MG CAPS, Take 1 mg by mouth daily., Disp: , Rfl:    SYNTHROID 75 MCG tablet, Take 1 tablet (75 mcg total) by mouth daily before breakfast., Disp: 90 tablet, Rfl: 3   Medications ordered in this encounter:  No orders of the defined types were placed in this encounter.    *If you need refills on other medications prior to your next appointment, please contact your pharmacy*  Follow-Up: Call back or seek an in-person evaluation if the symptoms worsen or if the condition fails to improve as anticipated.  [name] [phone]  Other  Instructions Continue with allergy medication and Flonase.  Can take Mucinex. Recommend Ibuprofen as needed for sore throat.  Can use warm salt water gargles.  Drink plenty of fluids.  Follow up for in person evaluation if no improvement.    If you have been instructed to have an in-person evaluation today at a local Urgent Care facility, please use the link below. It will take you to a list of all of our available [facility], including address, phone number and hours of operation. Please do not delay care.  [facility]  If you or a family member do not have a primary care provider, use the link below to schedule a visit and establish care. When you choose a [facility] primary care physician or advanced practice provider, you gain a long-term partner in health. Find a Primary Care Provider  Learn more about [facility]'s in-office and virtual care options: [facility]

## 2022-06-29 NOTE — Progress Notes (Signed)
Virtual Visit Consent   [name], you are scheduled for a virtual visit with a [facility] provider today. Just as with appointments in the office, your consent must be obtained to participate. Your consent will be active for this visit and any virtual visit you may have with one of our providers in the next 365 days. If you have a MyChart account, a copy of this consent can be sent to you electronically.  As this is a virtual visit, video technology does not allow for your provider to perform a traditional examination. This may limit your provider's ability to fully assess your condition. If your provider identifies any concerns that need to be evaluated in person or the need to arrange testing (such as labs, EKG, etc.), we will make arrangements to do so. Although advances in technology are sophisticated, we cannot ensure that it will always work on either your end or our end. If the connection with a video visit is poor, the visit may have to be switched to a telephone visit. With either a video or telephone visit, we are not always able to ensure that we have a secure connection.  By engaging in this virtual visit, you consent to the provision of healthcare and authorize for your insurance to be billed (if applicable) for the services provided during this visit. Depending on your insurance coverage, you may receive a charge related to this service.  I need to obtain your verbal consent now. Are you willing to proceed with your visit today? [name] has provided verbal consent on 06/29/2022 for a virtual visit (video or telephone). Lenise Arena Ward, PA-C  Date: 06/29/2022 6:14 PM  Virtual Visit via Video Note   I, Lenise Arena Ward, connected with  [name]  ([id number], 1966-02-28) on 06/29/22 at  6:15 PM EDT by a video-enabled telemedicine application and verified that I am speaking with the correct person using two identifiers.  Location: Patient: Virtual Visit Location Patient:  Home Provider: Virtual Visit Location Provider: Home Office   I discussed the limitations of evaluation and management by telemedicine and the availability of in person appointments. The patient expressed understanding and agreed to proceed.    History of Present Illness: [name] is a 57 y.o. who identifies as a female who was assigned female at birth, and is being seen today for mild sore throat that started this morning.  She also complains of some sinus pressure.  States that she has noted white spot on one tonsil.  Has some postnasal drip.  She is taking allergy medication and using flonase.  Denies fever, chill, cough.  Denies sick contacts. Marland Kitchen  HPI: HPI  Problems:  Patient Active Problem List   Diagnosis Date Noted   COVID-19 12/01/2020   Pharyngitis 12/01/2020   Preventative health care 06/16/2017   Obesity (BMI 30-39.9) 12/13/2013   Arthritis of right hip 06/29/2013   Status post THR (total hip replacement) 06/29/2013   Hypothyroidism 12/12/2008    Allergies: No Known Allergies Medications:  Current Outpatient Medications:    Ascorbic Acid (VITAMIN C) 1000 MG tablet, Take 1,000 mg by mouth daily., Disp: , Rfl:    B Complex-C (SUPER B COMPLEX PO), Take 1 tablet by mouth daily at 6 (six) AM., Disp: , Rfl:    cholecalciferol (VITAMIN D3) 25 MCG (1000 UNIT) tablet, Take 1,000 Units by mouth daily., Disp: , Rfl:    Flaxseed, Linseed, (RA FLAX SEED OIL) 1000 MG CAPS, Take 1 tablet by mouth daily  at 6 (six) AM., Disp: , Rfl:    fluticasone (FLONASE) 50 MCG/ACT nasal spray, Place 2 sprays into both nostrils daily., Disp: 16 g, Rfl: 6   liothyronine (CYTOMEL) 5 MCG tablet, Take 1 tablet (5 mcg total) by mouth daily., Disp: 90 tablet, Rfl: 2   Omega-3 Fatty Acids (FISH OIL) 1200 MG CAPS, Take 1 mg by mouth daily., Disp: , Rfl:    SYNTHROID 75 MCG tablet, Take 1 tablet (75 mcg total) by mouth daily before breakfast., Disp: 90 tablet, Rfl: 3  Observations/Objective: Patient is  well-developed, well-nourished in no acute distress.  Resting comfortably at home.  Head is normocephalic, atraumatic.  No labored breathing.  Speech is clear and coherent with logical content.  Patient is alert and oriented at baseline.    Assessment and Plan: 1. Pharyngitis, unspecified etiology  Sore throat viral vs due to allergy sx.  Advised continued use of flonase and allergy medication.  Advised Mucinex.   Follow Up Instructions: I discussed the assessment and treatment plan with the patient. The patient was provided an opportunity to ask questions and all were answered. The patient agreed with the plan and demonstrated an understanding of the instructions.  A copy of instructions were sent to the patient via MyChart unless otherwise noted below.     The patient was advised to call back or seek an in-person evaluation if the symptoms worsen or if the condition fails to improve as anticipated.  Time:  I spent 6 minutes with the patient via telehealth technology discussing the above problems/concerns.    Lenise Arena Ward, PA-C

## 2022-07-28 ENCOUNTER — Other Ambulatory Visit (INDEPENDENT_AMBULATORY_CARE_PROVIDER_SITE_OTHER): Payer: BC Managed Care – PPO

## 2022-07-28 DIAGNOSIS — E063 Autoimmune thyroiditis: Secondary | ICD-10-CM | POA: Diagnosis not present

## 2022-07-28 LAB — TSH: TSH: 0.87 u[IU]/mL (ref 0.35–5.50)

## 2022-12-08 ENCOUNTER — Encounter: Payer: Self-pay | Admitting: Internal Medicine

## 2022-12-08 ENCOUNTER — Ambulatory Visit: Payer: BC Managed Care – PPO | Admitting: Internal Medicine

## 2022-12-08 VITALS — BP 122/76 | HR 74 | Ht 64.0 in | Wt 188.0 lb

## 2022-12-08 DIAGNOSIS — E063 Autoimmune thyroiditis: Secondary | ICD-10-CM | POA: Diagnosis not present

## 2022-12-08 DIAGNOSIS — R6882 Decreased libido: Secondary | ICD-10-CM | POA: Diagnosis not present

## 2022-12-08 LAB — TSH: TSH: 0.7 u[IU]/mL (ref 0.35–5.50)

## 2022-12-08 MED ORDER — LIOTHYRONINE SODIUM 5 MCG PO TABS: 5.0000 ug | ORAL_TABLET | Freq: Every day | ORAL | 3 refills | Status: DC

## 2022-12-08 MED ORDER — SYNTHROID 75 MCG PO TABS: 75.0000 ug | ORAL_TABLET | Freq: Every day | ORAL | 3 refills | Status: DC

## 2022-12-08 NOTE — Patient Instructions (Signed)

## 2022-12-08 NOTE — Progress Notes (Unsigned)
Name: [name]  MRN/ DOB: [id number], 10-Nov-1965    Age/ Sex: 57 y.o., female    PCP: Zola Button, Grayling Congress, DO   Reason for Endocrinology Evaluation: Hypothyroidism     Date of Initial Endocrinology Evaluation: 01/25/2022    HPI: Ms. [name] is a 57 y.o. female with a past medical history of Hypothyroidism. The patient presented for initial endocrinology clinic visit on 01/25/2022 for consultative assistance with her Hypothyroidism.   Pt has been diagnosed with hypothyroidism >10 yrs ago , she has been on  LT-4 replacement     Pt with elevated anti-TPO 635 at 01/08/2022  NO FH of thyroid disease   On her initial visit to our clinic she was on levothyroxine 100 mcg daily, with a TSH of 0.35 u IU/mL.  Due to chronic and multiple symptoms we opted to try liothyronine and will reduce levothyroxine   SUBJECTIVE:    Today (12/08/22):  [name] is here for follow-up on Hashimoto's thyroiditis.  Weight overall stable  Libido is low  Denies local neck swelling  Continues with dry skin  Sleep is better  Denies constipation or diarrhea  Denies palpitations  Denies tremors  No Biotin   Synthroid 75 mcg daily Liothyronine 5 mcg daily    HISTORY:  Past Medical History:  Past Medical History:  Diagnosis Date   Arthritis    Family history of anesthesia complication    mother severe N/V   Hypothyroid    Past Surgical History:  Past Surgical History:  Procedure Laterality Date   NO PAST SURGERIES     TOTAL HIP ARTHROPLASTY Right 06/29/2013   Procedure: RIGHT TOTAL HIP ARTHROPLASTY ANTERIOR APPROACH;  Surgeon: Kathryne Hitch, MD;  Location: [facility];  Service: Orthopedics;  Laterality: Right;    Social History:  reports that she has never smoked. She has never used smokeless tobacco. She reports current alcohol use of about 2.0 standard drinks of alcohol per week. She reports that she does not use drugs. Family History: family history includes  Breast cancer in her maternal aunt and another family member; Diabetes in her father and paternal grandfather; Heart disease in her father; Hypertension in her mother; Throat cancer in her father.   HOME MEDICATIONS: Allergies as of 12/08/2022   No Known Allergies      Medication List        Accurate as of December 08, 2022  1:19 PM. If you have any questions, ask your nurse or doctor.          cholecalciferol 25 MCG (1000 UNIT) tablet Commonly known as: VITAMIN D3 Take 1,000 Units by mouth daily.   Fish Oil 1200 MG Caps Take 1 mg by mouth daily.   fluticasone 50 MCG/ACT nasal spray Commonly known as: FLONASE Place 2 sprays into both nostrils daily.   liothyronine 5 MCG tablet Commonly known as: CYTOMEL Take 1 tablet (5 mcg total) by mouth daily.   RA Flax Seed Oil 1000 MG Caps Take 1 tablet by mouth daily at 6 (six) AM.   SUPER B COMPLEX PO Take 1 tablet by mouth daily at 6 (six) AM.   Synthroid 75 MCG tablet Generic drug: levothyroxine Take 1 tablet (75 mcg total) by mouth daily before breakfast.   vitamin C 1000 MG tablet Take 1,000 mg by mouth daily.          REVIEW OF SYSTEMS: A comprehensive ROS was conducted with the patient and is negative except as per HPI  OBJECTIVE:  VS: BP 122/76 (BP Location: Left Arm, Patient Position: Sitting, Cuff Size: Large)   Pulse 74   Ht 5\' 4"  (1.626 m)   Wt 188 lb (85.3 kg)   LMP 05/05/2016   SpO2 99%   BMI 32.27 kg/m    Wt Readings from Last 3 Encounters:  12/08/22 188 lb (85.3 kg)  06/02/22 190 lb (86.2 kg)  01/25/22 185 lb 9.6 oz (84.2 kg)     EXAM: General: Pt appears well and is in NAD  Eyes: External eye exam normal without stare, lid lag or exophthalmos.  EOM intact.   Neck: General: Supple without adenopathy. Thyroid: Thyroid size normal.  No goiter or nodules appreciated. No thyroid bruit.  Lungs: Clear with good BS bilat with no rales, rhonchi, or wheezes  Heart: Auscultation: RRR.   Abdomen: Normoactive bowel sounds, soft, nontender, without masses or organomegaly palpable  Extremities:  BL LE: No pretibial edema normal ROM and strength.  Mental Status: Judgment, insight: Intact Orientation: Oriented to time, place, and person Mood and affect: No depression, anxiety, or agitation     DATA REVIEWED:  Latest Reference Range & Units 12/08/22 08:08  TSH 0.35 - 5.50 uIU/mL 0.70     Latest Reference Range & Units 01/08/22 09:07  Thyroperoxidase Ab SerPl-aCnc <9 IU/mL 635 (H)    ASSESSMENT/PLAN/RECOMMENDATIONS:   Hashimoto's Thyroiditis:  -Slight improvement in energy -TSH has normalized, we will switch Synthroid 88 mcg 6 days a week, to 75 mcg daily from now on -She understands to hold biotin 2-3 days prior to future thyroid lab work  -Medications :  Continue Synthroid 75 mcg daily Continue liothyronine 5 mcg daily  Labs in 6 months    Signed electronically by: Lyndle Herrlich, MD  [facility]  [facility] [address] [place], [place] [address] Phone: [phone] FAX: [phone]   CC: Donato Schultz, DO [address] [facility] [address] [place] [address] Phone: [phone] Fax: [phone]   Return to Endocrinology clinic as below: Future Appointments  Date Time Provider Department Center  01/20/2023  8:20 AM Laury Axon Irish Elders, DO [facility]  12/08/2023  7:50 AM Jennie Bolar, Konrad Dolores, MD [facility] None

## 2022-12-09 LAB — PROLACTIN: Prolactin: 10.3 ng/mL

## 2023-01-10 ENCOUNTER — Encounter: Payer: BC Managed Care – PPO | Admitting: Family Medicine

## 2023-01-19 ENCOUNTER — Other Ambulatory Visit (HOSPITAL_COMMUNITY)
Admission: RE | Admit: 2023-01-19 | Discharge: 2023-01-19 | Disposition: A | Discharge status: Home / Self Care | Payer: BC Managed Care – PPO | Source: Ambulatory Visit | Attending: Nurse Practitioner | Admitting: Nurse Practitioner

## 2023-01-19 ENCOUNTER — Other Ambulatory Visit: Payer: Self-pay | Admitting: Family Medicine

## 2023-01-19 DIAGNOSIS — Z124 Encounter for screening for malignant neoplasm of cervix: Secondary | ICD-10-CM | POA: Insufficient documentation

## 2023-01-19 DIAGNOSIS — Z1231 Encounter for screening mammogram for malignant neoplasm of breast: Secondary | ICD-10-CM

## 2023-01-20 ENCOUNTER — Encounter: Payer: Self-pay | Admitting: Family Medicine

## 2023-01-20 ENCOUNTER — Ambulatory Visit: Payer: BC Managed Care – PPO | Admitting: Family Medicine

## 2023-01-20 VITALS — BP 120/70 | HR 55 | Temp 97.9°F | Resp 18 | Ht 64.0 in | Wt 192.0 lb

## 2023-01-20 DIAGNOSIS — Z0000 Encounter for general adult medical examination without abnormal findings: Secondary | ICD-10-CM

## 2023-01-20 DIAGNOSIS — Z23 Encounter for immunization: Secondary | ICD-10-CM | POA: Diagnosis not present

## 2023-01-20 LAB — CBC WITH DIFFERENTIAL/PLATELET
Basophils Absolute: 0 10*3/uL (ref 0.0–0.1)
Basophils Relative: 0.8 % (ref 0.0–3.0)
Eosinophils Absolute: 0.1 10*3/uL (ref 0.0–0.7)
Eosinophils Relative: 2.2 % (ref 0.0–5.0)
HCT: 42.1 % (ref 36.0–46.0)
Hemoglobin: 13.6 g/dL (ref 12.0–15.0)
Lymphocytes Relative: 33.7 % (ref 12.0–46.0)
Lymphs Abs: 2 10*3/uL (ref 0.7–4.0)
MCHC: 32.3 g/dL (ref 30.0–36.0)
MCV: 91.9 fL (ref 78.0–100.0)
Monocytes Absolute: 0.4 10*3/uL (ref 0.1–1.0)
Monocytes Relative: 7.6 % (ref 3.0–12.0)
Neutro Abs: 3.3 10*3/uL (ref 1.4–7.7)
Neutrophils Relative %: 55.7 % (ref 43.0–77.0)
Platelets: 278 10*3/uL (ref 150.0–400.0)
RBC: 4.58 Mil/uL (ref 3.87–5.11)
RDW: 12.9 % (ref 11.5–15.5)
WBC: 5.9 10*3/uL (ref 4.0–10.5)

## 2023-01-20 LAB — COMPREHENSIVE METABOLIC PANEL
ALT: 14 U/L (ref 0–35)
AST: 15 U/L (ref 0–37)
Albumin: 4.3 g/dL (ref 3.5–5.2)
Alkaline Phosphatase: 86 U/L (ref 39–117)
BUN: 19 mg/dL (ref 6–23)
CO2: 29 meq/L (ref 19–32)
Calcium: 9.5 mg/dL (ref 8.4–10.5)
Chloride: 104 meq/L (ref 96–112)
Creatinine, Ser: 0.62 mg/dL (ref 0.40–1.20)
GFR: 97.29 mL/min (ref >60.00)
Glucose, Bld: 103 mg/dL — ABNORMAL HIGH (ref 70–99)
Potassium: 4.3 meq/L (ref 3.5–5.1)
Sodium: 141 meq/L (ref 135–145)
Total Bilirubin: 0.7 mg/dL (ref 0.2–1.2)
Total Protein: 6.5 g/dL (ref 6.0–8.3)

## 2023-01-20 LAB — LIPID PANEL
Cholesterol: 230 mg/dL — ABNORMAL HIGH (ref 0–200)
HDL: 70.2 mg/dL (ref >39.00)
LDL Cholesterol: 136 mg/dL — ABNORMAL HIGH (ref 0–99)
NonHDL: 159.85
Total CHOL/HDL Ratio: 3
Triglycerides: 117 mg/dL (ref 0.0–149.0)
VLDL: 23.4 mg/dL (ref 0.0–40.0)

## 2023-01-20 NOTE — Progress Notes (Signed)
Established Patient Office Visit  Subjective   Patient ID: [name], female    DOB: 1965/09/13  Age: 57 y.o. MRN: [id number]  Chief Complaint  Patient presents with   Annual Exam    Pt states fasting     HPI Discussed the use of AI scribe software for clinical note transcription with the patient, who gave verbal consent to proceed.  History of Present Illness   The patient, with a history of hypothyroidism managed by an endocrinologist, presents for an annual physical. She reports no new health concerns and is satisfied with her current thyroid management. She recently received a flu shot and had a gynecological exam. Her mammogram is scheduled for December and a colonoscopy is due next year. She has not had a bone density test, but consumes a diet rich in vegetables and takes vitamin D supplements. She does not consume much dairy or juice. She has regular dental and eye exams. The patient's,  mother is in good health and recently moved into an independent living facility. The patient has some concerns about the COVID-19 vaccine and does not think she will get anymore.      Patient Active Problem List   Diagnosis Date Noted   COVID-19 12/01/2020   Pharyngitis 12/01/2020   Preventative health care 06/16/2017   Obesity (BMI 30-39.9) 12/13/2013   Arthritis of right hip 06/29/2013   Status post THR (total hip replacement) 06/29/2013   Hypothyroidism 12/12/2008   Past Medical History:  Diagnosis Date   Arthritis    Family history of anesthesia complication    mother severe N/V   Hypothyroid    Past Surgical History:  Procedure Laterality Date   NO PAST SURGERIES     TOTAL HIP ARTHROPLASTY Right 06/29/2013   Procedure: RIGHT TOTAL HIP ARTHROPLASTY ANTERIOR APPROACH;  Surgeon: Kathryne Hitch, MD;  Location: [facility];  Service: Orthopedics;  Laterality: Right;   Social History   Tobacco Use   Smoking status: Never   Smokeless tobacco: Never  Vaping Use   Vaping  status: Never Used  Substance Use Topics   Alcohol use: Yes    Alcohol/week: 2.0 standard drinks of alcohol    Types: 2 Glasses of wine per week    Comment: Moderate   Drug use: No   Social History   Socioeconomic History   Marital status: Married    Spouse name: Not on file   Number of children: Not on file   Years of education: Not on file   Highest education level: Not on file  Occupational History   Not on file  Tobacco Use   Smoking status: Never   Smokeless tobacco: Never  Vaping Use   Vaping status: Never Used  Substance and Sexual Activity   Alcohol use: Yes    Alcohol/week: 2.0 standard drinks of alcohol    Types: 2 Glasses of wine per week    Comment: Moderate   Drug use: No   Sexual activity: Yes    Partners: Female  Other Topics Concern   Not on file  Social History Narrative   Walking and using bike    Lives with husband    3 children all out of the house---- 2 grand babies "twins"   Social Determinants of Health   Financial Resource Strain: Not on file  Food Insecurity: Not on file  Transportation Needs: Not on file  Physical Activity: Not on file  Stress: Not on file  Social Connections: Not on file  Intimate Partner Violence: Not on file   Family Status  Relation Name Status   Mother  Alive   Father  Deceased   PGF  (Not Specified)   Mat Aunt  (Not Specified)   Other  (Not Specified)  No partnership data on file   Family History  Problem Relation Age of Onset   Hypertension Mother    Diabetes Father    Throat cancer Father    Heart disease Father        Heart transplant   Diabetes Paternal Grandfather    Breast cancer Maternal Aunt        diagnosed in her 42's   Breast cancer Other        Aunt   No Known Allergies    ROS    Objective:     BP 120/70 (BP Location: Left Arm, Patient Position: Sitting, Cuff Size: Normal)   Pulse (!) 55   Temp 97.9 F (36.6 C) (Oral)   Resp 18   Ht 5\' 4"  (1.626 m)   Wt 192 lb (87.1 kg)    LMP 05/05/2016   SpO2 98%   BMI 32.96 kg/m  BP Readings from Last 3 Encounters:  01/20/23 120/70  12/08/22 122/76  06/02/22 110/70   Wt Readings from Last 3 Encounters:  01/20/23 192 lb (87.1 kg)  12/08/22 188 lb (85.3 kg)  06/02/22 190 lb (86.2 kg)   SpO2 Readings from Last 3 Encounters:  01/20/23 98%  12/08/22 99%  06/02/22 97%      Physical Exam   No results found for any visits on 01/20/23.  Last CBC Lab Results  Component Value Date   WBC 5.4 01/08/2022   HGB 13.8 01/08/2022   HCT 41.1 01/08/2022   MCV 90.7 01/08/2022   MCH 30.1 11/23/2019   RDW 12.8 01/08/2022   PLT 246.0 01/08/2022   Last metabolic panel Lab Results  Component Value Date   GLUCOSE 103 (H) 01/08/2022   NA 141 01/08/2022   K 4.5 01/08/2022   CL 104 01/08/2022   CO2 28 01/08/2022   BUN 19 01/08/2022   CREATININE 0.71 01/08/2022   GFR 93.57 01/08/2022   CALCIUM 9.6 01/08/2022   PROT 7.2 01/08/2022   ALBUMIN 4.6 01/08/2022   BILITOT 0.7 01/08/2022   ALKPHOS 85 01/08/2022   AST 15 01/08/2022   ALT 14 01/08/2022   Last lipids Lab Results  Component Value Date   CHOL 224 (H) 01/08/2022   HDL 77.80 01/08/2022   LDLCALC 126 (H) 01/08/2022   LDLDIRECT 159.6 10/19/2012   TRIG 100.0 01/08/2022   CHOLHDL 3 01/08/2022   Last hemoglobin A1c No results found for: "HGBA1C" Last thyroid functions Lab Results  Component Value Date   TSH 0.70 12/08/2022   T4TOTAL 8.7 01/08/2022   Last vitamin D Lab Results  Component Value Date   VD25OH 25.32 (L) 01/08/2022   Last vitamin B12 and Folate Lab Results  Component Value Date   VITAMINB12 321 01/08/2022      The 10-year ASCVD risk score (Arnett DK, et al., 2019) is: 2.2%    Assessment & Plan:   Problem List Items Addressed This Visit       Unprioritized   Preventative health care - Primary   Relevant Orders   CBC with Differential/Platelet   Comprehensive metabolic panel   Lipid panel   Other Visit Diagnoses     Need  for influenza vaccination       Relevant Orders   Flu  vaccine trivalent PF, 6mos and older(Flulaval,Afluria,Fluarix,Fluzone)     Assessment and Plan    General Health Maintenance Up-to-date with most preventive care measures. Recently received flu shot. Mammogram due in December. Colonoscopy due next year. Tetanus not due until 2032. Bone density scan discussed, but not yet indicated based on current guidelines. -Continue current preventive care measures. -Consider RSV vaccine, available at the pharmacy for individuals over 60.  Dietary Calcium Intake Limited dairy intake, but consumes vegetables and calcium-fortified foods. -Continue current dietary habits.  COVID-19 Vaccination Patient has concerns about receiving additional COVID-19 vaccines .Marland Kitchen Discussed the importance of mask-wearing and testing. -Patient to consider additional COVID-19 vaccination and make an informed decision.        No follow-ups on file.    Donato Schultz, DO

## 2023-01-20 NOTE — Patient Instructions (Signed)
Preventive Care 40-59 Years Old, Female Preventive care refers to lifestyle choices and visits with your health care provider that can promote health and wellness. Preventive care visits are also called wellness exams. What can I expect for my preventive care visit? Counseling Your health care provider may ask you questions about your: Medical history, including: Past medical problems. Family medical history. Pregnancy history. Current health, including: Menstrual cycle. Method of birth control. Emotional well-being. Home life and relationship well-being. Sexual activity and sexual health. Lifestyle, including: Alcohol, nicotine or tobacco, and drug use. Access to firearms. Diet, exercise, and sleep habits. Work and work environment. Sunscreen use. Safety issues such as seatbelt and bike helmet use. Physical exam Your health care provider will check your: Height and weight. These may be used to calculate your BMI (body mass index). BMI is a measurement that tells if you are at a healthy weight. Waist circumference. This measures the distance around your waistline. This measurement also tells if you are at a healthy weight and may help predict your risk of certain diseases, such as type 2 diabetes and high blood pressure. Heart rate and blood pressure. Body temperature. Skin for abnormal spots. What immunizations do I need?  Vaccines are usually given at various ages, according to a schedule. Your health care provider will recommend vaccines for you based on your age, medical history, and lifestyle or other factors, such as travel or where you work. What tests do I need? Screening Your health care provider may recommend screening tests for certain conditions. This may include: Lipid and cholesterol levels. Diabetes screening. This is done by checking your blood sugar (glucose) after you have not eaten for a while (fasting). Pelvic exam and Pap test. Hepatitis B test. Hepatitis C  test. HIV (human immunodeficiency virus) test. STI (sexually transmitted infection) testing, if you are at risk. Lung cancer screening. Colorectal cancer screening. Mammogram. Talk with your health care provider about when you should start having regular mammograms. This may depend on whether you have a family history of breast cancer. BRCA-related cancer screening. This may be done if you have a family history of breast, ovarian, tubal, or peritoneal cancers. Bone density scan. This is done to screen for osteoporosis. Talk with your health care provider about your test results, treatment options, and if necessary, the need for more tests. Follow these instructions at home: Eating and drinking  Eat a diet that includes fresh fruits and vegetables, whole grains, lean protein, and low-fat dairy products. Take vitamin and mineral supplements as recommended by your health care provider. Do not drink alcohol if: Your health care provider tells you not to drink. You are pregnant, may be pregnant, or are planning to become pregnant. If you drink alcohol: Limit how much you have to 0-1 drink a day. Know how much alcohol is in your drink. In the [place] one drink equals one 12 oz bottle of beer (355 mL), one 5 oz glass of wine (148 mL), or one 1 oz glass of hard liquor (44 mL). Lifestyle Brush your teeth every morning and night with fluoride toothpaste. Floss one time each day. Exercise for at least 30 minutes 5 or more days each week. Do not use any products that contain nicotine or tobacco. These products include cigarettes, chewing tobacco, and vaping devices, such as e-cigarettes. If you need help quitting, ask your health care provider. Do not use drugs. If you are sexually active, practice safe sex. Use a condom or other form of protection to   prevent STIs. If you do not wish to become pregnant, use a form of birth control. If you plan to become pregnant, see your health care provider for a  prepregnancy visit. Take aspirin only as told by your health care provider. Make sure that you understand how much to take and what form to take. Work with your health care provider to find out whether it is safe and beneficial for you to take aspirin daily. Find healthy ways to manage stress, such as: Meditation, yoga, or listening to music. Journaling. Talking to a trusted person. Spending time with friends and family. Minimize exposure to UV radiation to reduce your risk of skin cancer. Safety Always wear your seat belt while driving or riding in a vehicle. Do not drive: If you have been drinking alcohol. Do not ride with someone who has been drinking. When you are tired or distracted. While texting. If you have been using any mind-altering substances or drugs. Wear a helmet and other protective equipment during sports activities. If you have firearms in your house, make sure you follow all gun safety procedures. Seek help if you have been physically or sexually abused. What's next? Visit your health care provider once a year for an annual wellness visit. Ask your health care provider how often you should have your eyes and teeth checked. Stay up to date on all vaccines. This information is not intended to replace advice given to you by your health care provider. Make sure you discuss any questions you have with your health care provider. Document Revised: 09/24/2020 Document Reviewed: 09/24/2020 Elsevier Patient Education  2024 Elsevier Inc.  

## 2023-01-21 LAB — CYTOLOGY - PAP
Comment: NEGATIVE
Diagnosis: NEGATIVE
High risk HPV: NEGATIVE

## 2023-03-21 ENCOUNTER — Ambulatory Visit
Admission: RE | Admit: 2023-03-21 | Discharge: 2023-03-21 | Disposition: A | Discharge status: Home / Self Care | Payer: BC Managed Care – PPO | Source: Ambulatory Visit | Attending: Family Medicine | Admitting: Family Medicine

## 2023-03-21 DIAGNOSIS — Z1231 Encounter for screening mammogram for malignant neoplasm of breast: Secondary | ICD-10-CM

## 2023-05-28 IMAGING — MG MM DIGITAL SCREENING BILAT W/ TOMO AND CAD
8 series · 8 of 24 positions shown · non-contrast
Comparison: Previous exam(s).

ACR Breast Density Category a: The breast tissue is almost entirely
fatty.

CLINICAL DATA: Screening.

EXAM:
DIGITAL SCREENING BILATERAL MAMMOGRAM WITH TOMOSYNTHESIS AND CAD
TECHNIQUE: Bilateral screening digital craniocaudal and mediolateral oblique
mammograms were obtained. Bilateral screening digital breast
tomosynthesis was performed. The images were evaluated with
computer-aided detection.

[R CC synth-2D]
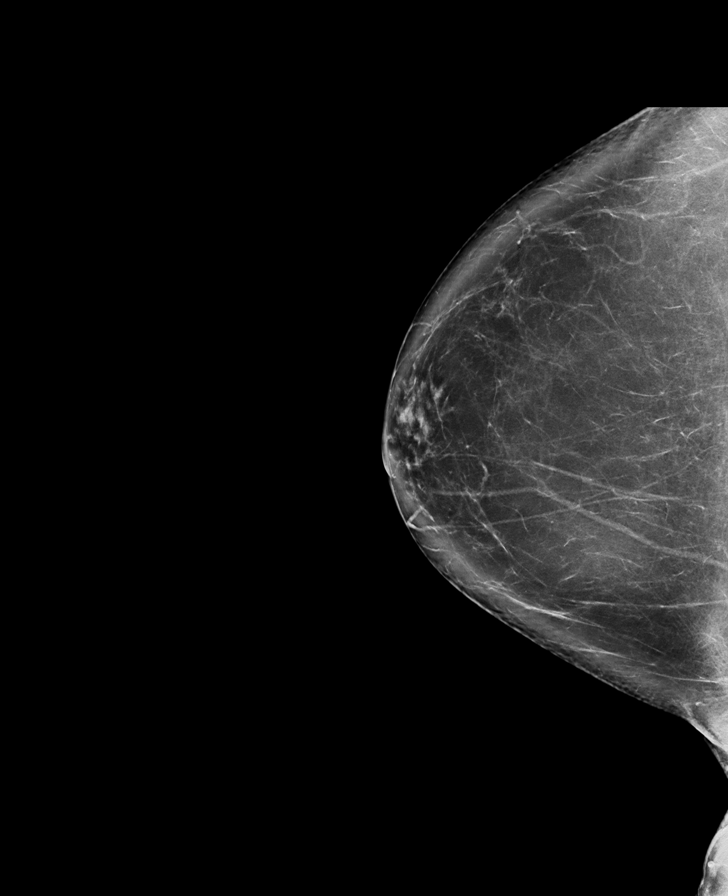

[R MLO synth-2D]
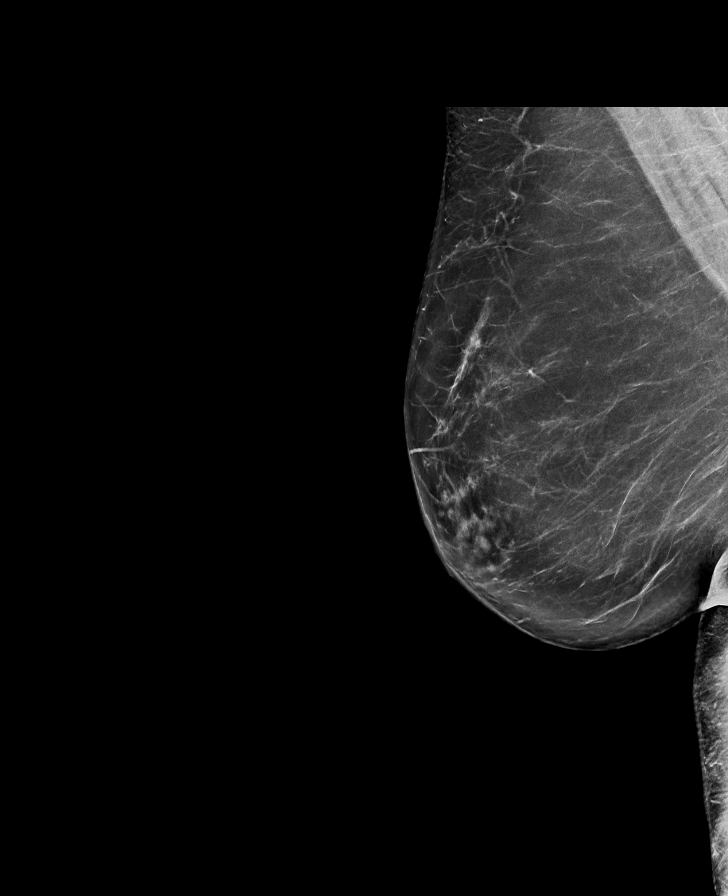

[L MLO synth-2D]
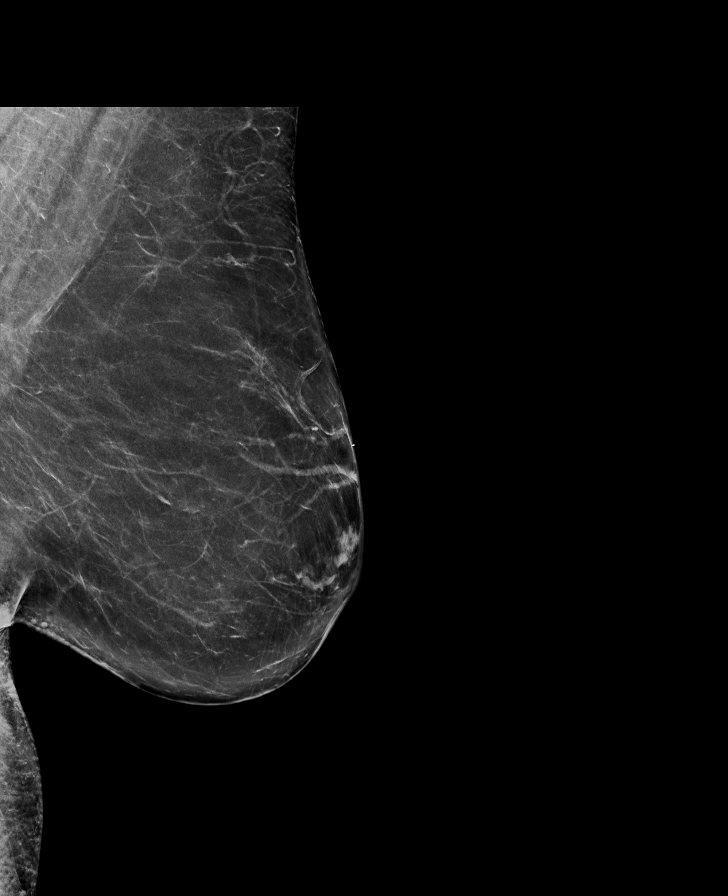

[L CC synth-2D]
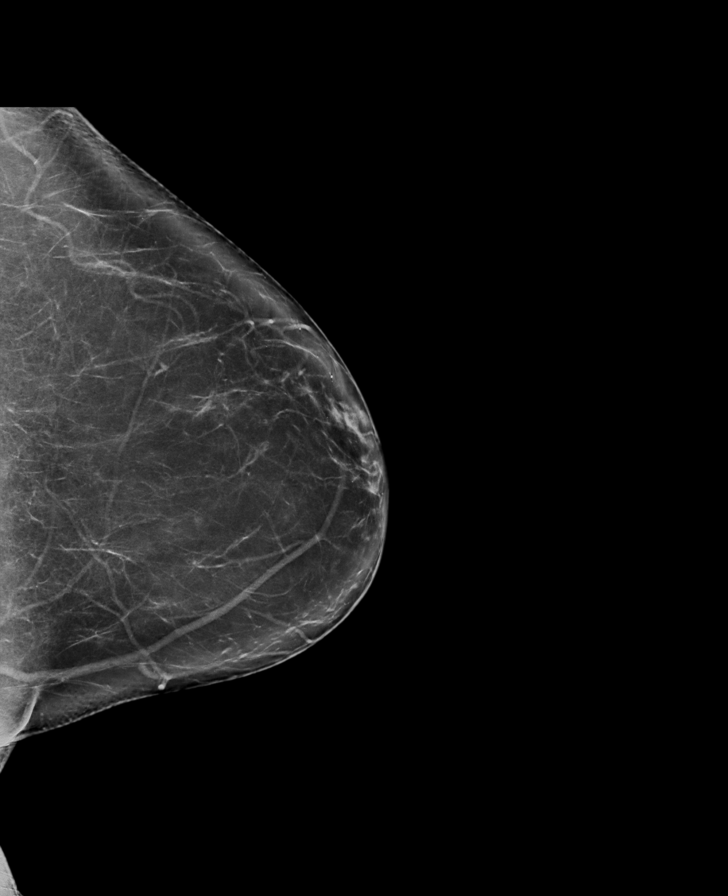

[R CC tomo · tomo slice 48/95.0]
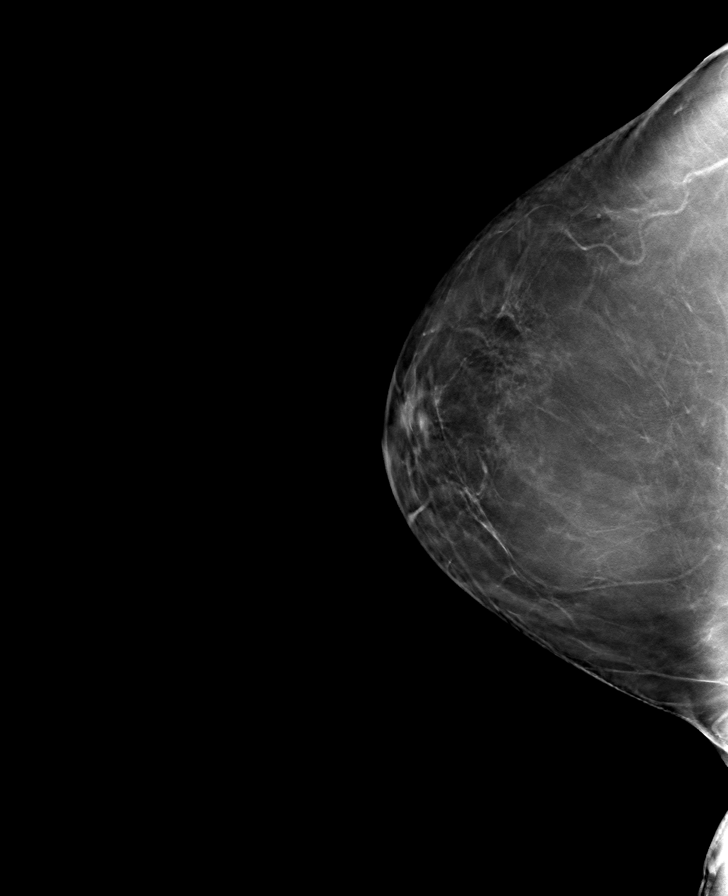

[R MLO tomo · tomo slice 45/90.0]
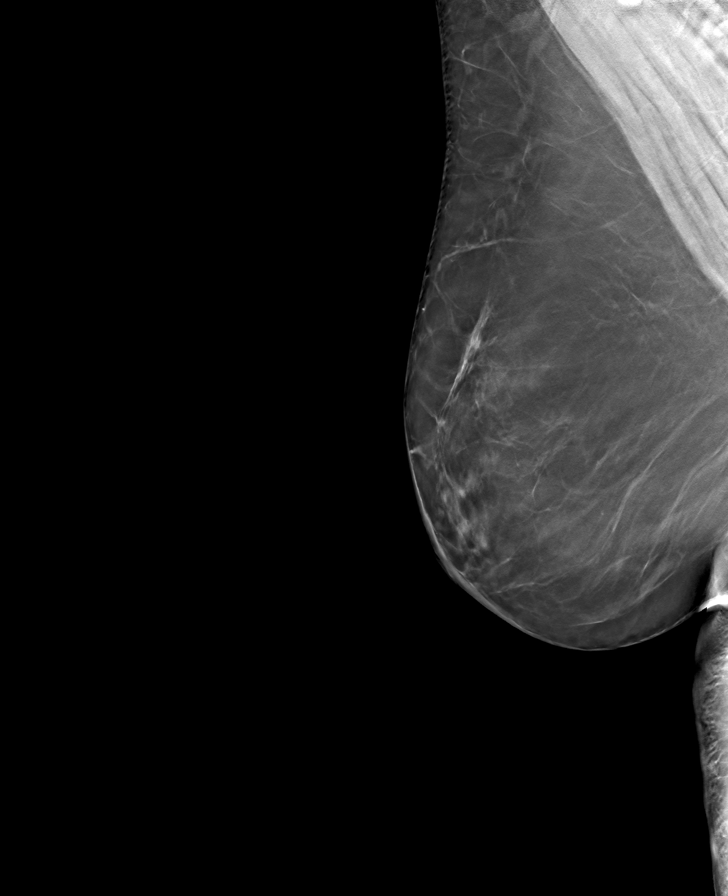

[L CC tomo · tomo slice 43/85.0]
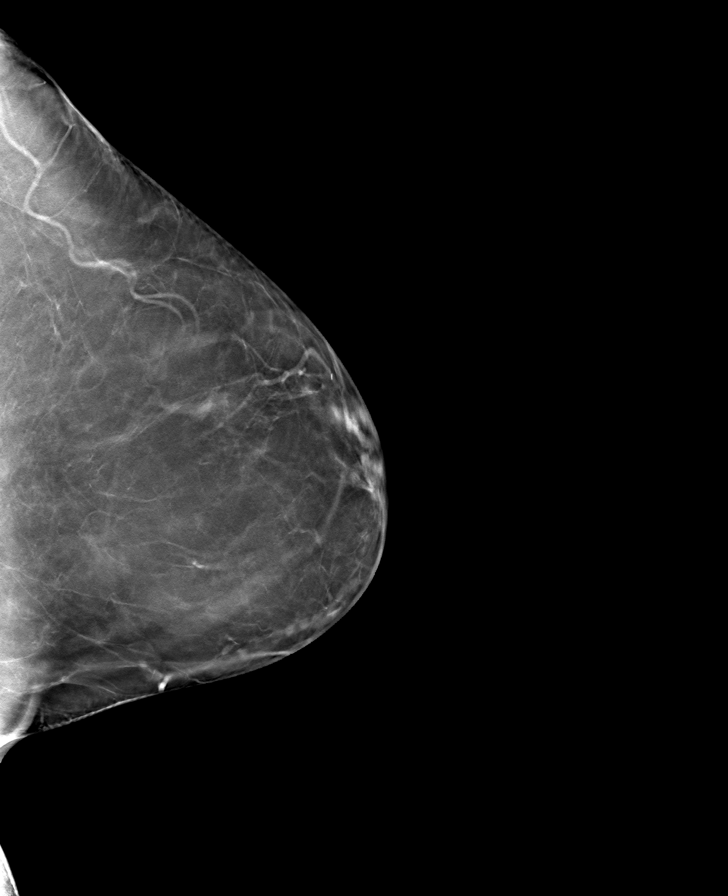

[L MLO tomo · tomo slice 43/84.0]
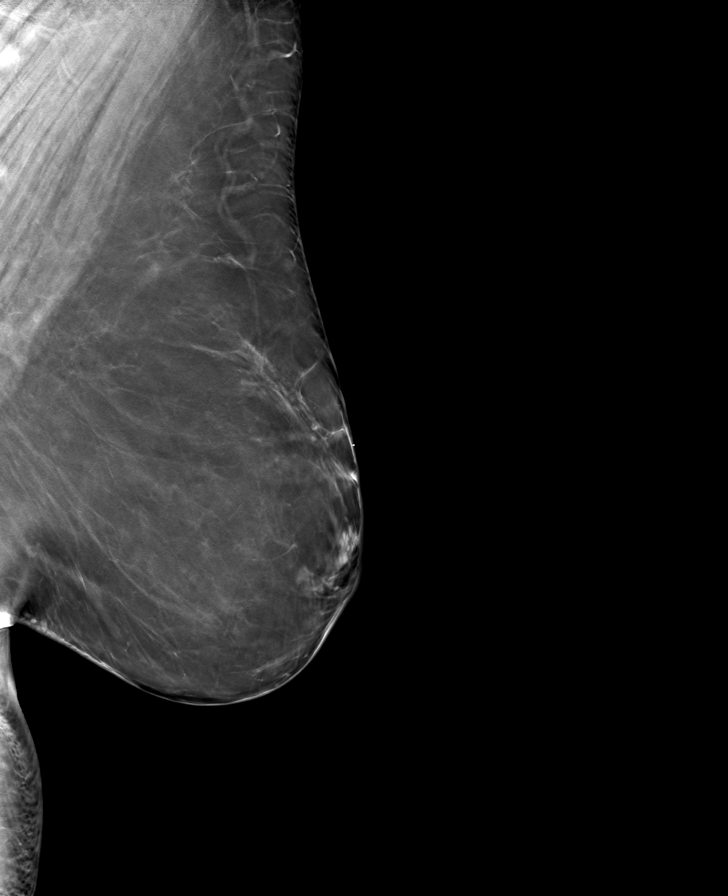

[8 of 24 positions shown; findings below may reference images not displayed]

FINDINGS: There are no findings suspicious for malignancy.
IMPRESSION: No mammographic evidence of malignancy. A result letter of this
screening mammogram will be mailed directly to the patient.

RECOMMENDATION:
Screening mammogram in one year. (Code:0E-3-N98)

BI-RADS CATEGORY  1: Negative.

## 2023-12-08 ENCOUNTER — Ambulatory Visit: Payer: BC Managed Care – PPO | Admitting: Internal Medicine

## 2023-12-08 ENCOUNTER — Encounter: Payer: Self-pay | Admitting: Internal Medicine

## 2023-12-08 VITALS — BP 110/70 | HR 63 | Ht 64.0 in | Wt 190.0 lb

## 2023-12-08 DIAGNOSIS — E063 Autoimmune thyroiditis: Secondary | ICD-10-CM

## 2023-12-08 DIAGNOSIS — E559 Vitamin D deficiency, unspecified: Secondary | ICD-10-CM

## 2023-12-08 LAB — VITAMIN D 25 HYDROXY (VIT D DEFICIENCY, FRACTURES): Vit D, 25-Hydroxy: 27 ng/mL — ABNORMAL LOW (ref 30–100)

## 2023-12-08 LAB — T4, FREE: Free T4: 1.1 ng/dL (ref 0.8–1.8)

## 2023-12-08 LAB — TSH: TSH: 1.16 m[IU]/L (ref 0.40–4.50)

## 2023-12-08 NOTE — Progress Notes (Unsigned)
 Name: [name]  MRN/ DOB: [id number], [birth year]    Age/ Sex: 58 y.o., female    PCP: Antonio Meth, Jamee SAUNDERS, DO   Reason for Endocrinology Evaluation: Hypothyroidism     Date of Initial Endocrinology Evaluation: 01/25/2022    HPI: [name] is a 58 y.o. female with a past medical history of Hypothyroidism. The patient presented for initial endocrinology clinic visit on 01/25/2022 for consultative assistance with her Hypothyroidism.   Pt has been diagnosed with hypothyroidism >10 yrs ago , she has been on  LT-4 replacement     Pt with elevated anti-TPO 635 at 01/08/2022  NO FH of thyroid  disease   On her initial visit to our clinic she was on levothyroxine  100 mcg daily, with a TSH of 0.35 u IU/mL.  Due to chronic and multiple symptoms we opted to try liothyronine  and will reduce levothyroxine    SUBJECTIVE:    Today (12/08/23):  [name] is here for follow-up on Hashimoto's thyroiditis.   Weight continues to be overall stable Energy level is stable but wakes up tired despite sleeping well at night  She doesn't snore  Denies local neck swelling  Continues with dry skin Has occasional constipation   Denies palpitations  No Biotin     Synthroid  75 mcg daily Liothyronine  5 mcg daily Vitamin D  1000 units daily     HISTORY:  Past Medical History:  Past Medical History:  Diagnosis Date   Arthritis    Family history of anesthesia complication    mother severe N/V   Hypothyroid    Past Surgical History:  Past Surgical History:  Procedure Laterality Date   NO PAST SURGERIES     TOTAL HIP ARTHROPLASTY Right 06/29/2013   Procedure: RIGHT TOTAL HIP ARTHROPLASTY ANTERIOR APPROACH;  Surgeon: Lonni CINDERELLA Poli, MD;  Location: [facility];  Service: Orthopedics;  Laterality: Right;    Social History:  reports that she has never smoked. She has never used smokeless tobacco. She reports current alcohol use of about 2.0 standard drinks of alcohol per  week. She reports that she does not use drugs. Family History: family history includes Breast cancer in her maternal aunt and another family member; Diabetes in her father and paternal grandfather; Heart disease in her father; Hypertension in her mother; Throat cancer in her father.   HOME MEDICATIONS: Allergies as of 12/08/2023   No Known Allergies      Medication List        Accurate as of December 08, 2023  7:54 AM. If you have any questions, ask your nurse or doctor.          cholecalciferol 25 MCG (1000 UNIT) tablet Commonly known as: VITAMIN D3 Take 1,000 Units by mouth daily.   Fish Oil 1200 MG Caps Take 1 mg by mouth daily.   fluticasone  50 MCG/ACT nasal spray Commonly known as: FLONASE  Place 2 sprays into both nostrils daily.   liothyronine  5 MCG tablet Commonly known as: CYTOMEL  Take 1 tablet (5 mcg total) by mouth daily.   RA Flax Seed Oil 1000 MG Caps Take 1 tablet by mouth daily at 6 (six) AM.   SUPER B COMPLEX PO Take 1 tablet by mouth daily at 6 (six) AM.   Synthroid  75 MCG tablet Generic drug: levothyroxine  Take 1 tablet (75 mcg total) by mouth daily before breakfast.   vitamin C 1000 MG tablet Take 1,000 mg by mouth daily.          REVIEW  OF SYSTEMS: A comprehensive ROS was conducted with the patient and is negative except as per HPI    OBJECTIVE:  VS: BP 110/70 (BP Location: Left Arm, Patient Position: Sitting, Cuff Size: Normal)   Pulse 63   Ht 5' 4 (1.626 m)   Wt 190 lb (86.2 kg)   LMP 05/05/2016   SpO2 99%   BMI 32.61 kg/m    Wt Readings from Last 3 Encounters:  12/08/23 190 lb (86.2 kg)  01/20/23 192 lb (87.1 kg)  12/08/22 188 lb (85.3 kg)     EXAM: General: Pt appears well and is in NAD  Eyes: External eye exam normal without stare, lid lag or exophthalmos.  EOM intact.   Neck: General: Supple without adenopathy. Thyroid : Thyroid  size normal.  No goiter or nodules appreciated. No thyroid  bruit.  Lungs: Clear with good  BS bilat with no rales, rhonchi, or wheezes  Heart: Auscultation: RRR.  Abdomen: Normoactive bowel sounds, soft, nontender, without masses or organomegaly palpable  Extremities:  BL LE: No pretibial edema normal ROM and strength.  Mental Status: Judgment, insight: Intact Orientation: Oriented to time, place, and person Mood and affect: No depression, anxiety, or agitation     DATA REVIEWED:  Latest Reference Range & Units 12/08/23 08:24  Vitamin D , 25-Hydroxy 30 - 100 ng/mL 27 (L)    Latest Reference Range & Units 12/08/23 08:24  TSH 0.40 - 4.50 mIU/L 1.16  T4,Free(Direct) 0.8 - 1.8 ng/dL 1.1      Latest Reference Range & Units 01/08/22 09:07  Thyroperoxidase Ab SerPl-aCnc <9 IU/mL 635 (H)    ASSESSMENT/PLAN/RECOMMENDATIONS:   Hashimoto's Thyroiditis:  - No local neck symptoms -TFTs remain within normal range -No change at this time   Medications :  Continue Synthroid  75 mcg daily Continue liothyronine  5 mcg daily   2.  Vitamin D  insufficiency:  - Vitamin D  continues to be slightly low, will increase vitamin D  as below  Medication  Increase vitamin D3 2000 international units daily     Labs in 1 yr     Signed electronically by: Stefano Redgie Butts, MD  [facility] Endocrinology  [facility] [address] [place] [place], [place] [address] Phone: [phone] FAX: [phone]   CC: Antonio Cyndee Jamee JONELLE, DO [address] [place] [address] Phone: [phone] Fax: [phone]   Return to Endocrinology clinic as below: Future Appointments  Date Time Provider Department Center  01/23/2024  8:20 AM Antonio Cyndee Jamee JONELLE, DO [facility] [address]

## 2023-12-09 ENCOUNTER — Ambulatory Visit: Payer: Self-pay | Admitting: Internal Medicine

## 2023-12-09 MED ORDER — VITAMIN D3 50 MCG (2000 UT) PO CAPS: 2000.0000 [IU] | ORAL_CAPSULE | Freq: Every day | ORAL | Status: AC

## 2023-12-09 MED ORDER — LIOTHYRONINE SODIUM 5 MCG PO TABS: 5.0000 ug | ORAL_TABLET | Freq: Every day | ORAL | 3 refills | Status: AC

## 2023-12-09 MED ORDER — SYNTHROID 75 MCG PO TABS: 75.0000 ug | ORAL_TABLET | Freq: Every day | ORAL | 3 refills | Status: AC

## 2024-01-23 ENCOUNTER — Encounter: Payer: Self-pay | Admitting: Family Medicine

## 2024-01-23 ENCOUNTER — Ambulatory Visit: Payer: BC Managed Care – PPO | Admitting: Family Medicine

## 2024-01-23 VITALS — BP 104/70 | HR 53 | Temp 98.2°F | Resp 16 | Ht 64.0 in | Wt 191.8 lb

## 2024-01-23 DIAGNOSIS — E2839 Other primary ovarian failure: Secondary | ICD-10-CM

## 2024-01-23 DIAGNOSIS — D229 Melanocytic nevi, unspecified: Secondary | ICD-10-CM

## 2024-01-23 DIAGNOSIS — Z23 Encounter for immunization: Secondary | ICD-10-CM

## 2024-01-23 DIAGNOSIS — E039 Hypothyroidism, unspecified: Secondary | ICD-10-CM | POA: Diagnosis not present

## 2024-01-23 DIAGNOSIS — Z0000 Encounter for general adult medical examination without abnormal findings: Secondary | ICD-10-CM

## 2024-01-23 LAB — COMPREHENSIVE METABOLIC PANEL WITH GFR
ALT: 12 U/L (ref 0–35)
AST: 14 U/L (ref 0–37)
Albumin: 4.5 g/dL (ref 3.5–5.2)
Alkaline Phosphatase: 85 U/L (ref 39–117)
BUN: 16 mg/dL (ref 6–23)
CO2: 29 meq/L (ref 19–32)
Calcium: 9.5 mg/dL (ref 8.4–10.5)
Chloride: 105 meq/L (ref 96–112)
Creatinine, Ser: 0.69 mg/dL (ref 0.40–1.20)
GFR: 94.15 mL/min (ref >60.00)
Glucose, Bld: 109 mg/dL — ABNORMAL HIGH (ref 70–99)
Potassium: 4.6 meq/L (ref 3.5–5.1)
Sodium: 141 meq/L (ref 135–145)
Total Bilirubin: 0.8 mg/dL (ref 0.2–1.2)
Total Protein: 6.8 g/dL (ref 6.0–8.3)

## 2024-01-23 LAB — CBC WITH DIFFERENTIAL/PLATELET
Basophils Absolute: 0 K/uL (ref 0.0–0.1)
Basophils Relative: 0.8 % (ref 0.0–3.0)
Eosinophils Absolute: 0.1 K/uL (ref 0.0–0.7)
Eosinophils Relative: 2.6 % (ref 0.0–5.0)
HCT: 40.8 % (ref 36.0–46.0)
Hemoglobin: 13.3 g/dL (ref 12.0–15.0)
Lymphocytes Relative: 35.4 % (ref 12.0–46.0)
Lymphs Abs: 1.8 K/uL (ref 0.7–4.0)
MCHC: 32.5 g/dL (ref 30.0–36.0)
MCV: 90.6 fl (ref 78.0–100.0)
Monocytes Absolute: 0.4 K/uL (ref 0.1–1.0)
Monocytes Relative: 8.2 % (ref 3.0–12.0)
Neutro Abs: 2.7 K/uL (ref 1.4–7.7)
Neutrophils Relative %: 53 % (ref 43.0–77.0)
Platelets: 236 K/uL (ref 150.0–400.0)
RBC: 4.51 Mil/uL (ref 3.87–5.11)
RDW: 13.4 % (ref 11.5–15.5)
WBC: 5 K/uL (ref 4.0–10.5)

## 2024-01-23 LAB — LIPID PANEL
Cholesterol: 219 mg/dL — ABNORMAL HIGH (ref 0–200)
HDL: 69.4 mg/dL (ref >39.00)
LDL Cholesterol: 124 mg/dL — ABNORMAL HIGH (ref 0–99)
NonHDL: 149.12
Total CHOL/HDL Ratio: 3
Triglycerides: 127 mg/dL (ref 0.0–149.0)
VLDL: 25.4 mg/dL (ref 0.0–40.0)

## 2024-01-23 LAB — TSH: TSH: 0.94 u[IU]/mL (ref 0.35–5.50)

## 2024-01-23 NOTE — Assessment & Plan Note (Signed)
 Ghm utd Check labs  See AVS Health Maintenance  Topic Date Due   Pneumococcal Vaccine: 50+ Years (1 of 1 - PCV) Never done   Influenza Vaccine  11/11/2023   COVID-19 Vaccine (4 - 2025-26 season) 12/12/2023   Colonoscopy  03/01/2024   Mammogram  03/20/2024   Cervical Cancer Screening (HPV/Pap Cotest)  01/19/2028   DTaP/Tdap/Td (4 - Td or Tdap) 01/06/2031   Hepatitis C Screening  Completed   HIV Screening  Completed   Zoster Vaccines- Shingrix   Completed   Hepatitis B Vaccines 19-59 Average Risk  Aged Out   HPV VACCINES  Aged Out   Meningococcal B Vaccine  Aged Out

## 2024-01-23 NOTE — Progress Notes (Signed)
 Subjective:    Patient ID: [name], female    DOB: [birth year], 58 y.o.   MRN: [id number]  Chief Complaint  Patient presents with   Annual Exam    Pt states fasting     HPI Patient is in today for cpe. Discussed the use of AI scribe software for clinical note transcription with the patient, who gave verbal consent to proceed.  History of Present Illness [name] is a 58 year old female who presents for an annual physical exam.  She requests a flu shot and is considering a pneumonia vaccine due to the updated age recommendations.  She wants to lose weight but finds it challenging, especially with her thyroid  issue, which is managed by her endocrinologist. She is attempting to exercise but finds it inconsistent.  She has a skin lesion and is considering seeing a dermatologist for further evaluation.  She is due for a colonoscopy this year, with her last one performed at [facility]. Her mammogram is due in December, and she has not yet had a bone density test, which she plans to discuss with her OBGYN next month.  No changes in her family history and no new surgeries.  Her stomach and joints are doing well.   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   Procedure: RIGHT TOTAL HIP ARTHROPLASTY ANTERIOR APPROACH;  Surgeon: Lonni CINDERELLA Poli, MD;  Location: [facility];  Service: Orthopedics;  Laterality: Right;    Family History  Problem Relation Age of Onset   Hypertension Mother    Diabetes Father    Throat cancer Father    Heart disease Father        Heart transplant   Diabetes Paternal Grandfather    Breast cancer Maternal Aunt        diagnosed in her 24's   Breast cancer Other        Aunt    Social History   Socioeconomic History   Marital  status: Married    Spouse name: Not on file   Number of children: Not on file   Years of education: Not on file   Highest education level: Not on file  Occupational History   Not on file  Tobacco Use   Smoking status: Never   Smokeless tobacco: Never  Vaping Use   Vaping status: Never Used  Substance and Sexual Activity   Alcohol use: Yes    Alcohol/week: 2.0 standard drinks of alcohol    Types: 2 Glasses of wine per week    Comment: Moderate   Drug use: No   Sexual activity: Yes    Partners: Female  Other Topics Concern   Not on file  Social History Narrative   Walking and using bike    Lives with husband    3 children all out of the house---- 2 grand babies twins   Social Drivers of Corporate investment banker Strain: Not on file  Food Insecurity: Not on file  Transportation Needs: Not on file  Physical Activity: Not on file  Stress: Not on file  Social Connections: Not on file  Intimate Partner Violence: Not on file    Outpatient Medications Prior to Visit  Medication Sig Dispense Refill   Ascorbic Acid (  VITAMIN C) 1000 MG tablet Take 1,000 mg by mouth daily.     B Complex-C (SUPER B COMPLEX PO) Take 1 tablet by mouth daily at 6 (six) AM.     Cholecalciferol (VITAMIN D3) 50 MCG (2000 UT) capsule Take 1 capsule (2,000 Units total) by mouth daily.     fluticasone  (FLONASE ) 50 MCG/ACT nasal spray Place 2 sprays into both nostrils daily. 16 g 6   liothyronine  (CYTOMEL ) 5 MCG tablet Take 1 tablet (5 mcg total) by mouth daily. 90 tablet 3   Omega-3 Fatty Acids (FISH OIL) 1200 MG CAPS Take 1 mg by mouth daily.     SYNTHROID  75 MCG tablet Take 1 tablet (75 mcg total) by mouth daily before breakfast. 90 tablet 3   Flaxseed, Linseed, (RA FLAX SEED OIL) 1000 MG CAPS Take 1 tablet by mouth daily at 6 (six) AM.     No facility-administered medications prior to visit.    No Known Allergies  ROS     Objective:    Physical Exam  BP 104/70 (BP Location: Left Arm,  Patient Position: Sitting, Cuff Size: Large)   Pulse (!) 53   Temp 98.2 F (36.8 C) (Oral)   Resp 16   Ht 5' 4 (1.626 m)   Wt 191 lb 12.8 oz (87 kg)   LMP 05/05/2016   SpO2 98%   BMI 32.92 kg/m  Wt Readings from Last 3 Encounters:  01/23/24 191 lb 12.8 oz (87 kg)  12/08/23 190 lb (86.2 kg)  01/20/23 192 lb (87.1 kg)    Diabetic Foot Exam - Simple   No data filed    Lab Results  Component Value Date   WBC 5.9 01/20/2023   HGB 13.6 01/20/2023   HCT 42.1 01/20/2023   PLT 278.0 01/20/2023   GLUCOSE 103 (H) 01/20/2023   CHOL 230 (H) 01/20/2023   TRIG 117.0 01/20/2023   HDL 70.20 01/20/2023   LDLDIRECT 159.6 10/19/2012   LDLCALC 136 (H) 01/20/2023   ALT 14 01/20/2023   AST 15 01/20/2023   NA 141 01/20/2023   K 4.3 01/20/2023   CL 104 01/20/2023   CREATININE 0.62 01/20/2023   BUN 19 01/20/2023   CO2 29 01/20/2023   TSH 1.16 12/08/2023   INR 1.00 06/25/2013    Lab Results  Component Value Date   TSH 1.16 12/08/2023   Lab Results  Component Value Date   WBC 5.9 01/20/2023   HGB 13.6 01/20/2023   HCT 42.1 01/20/2023   MCV 91.9 01/20/2023   PLT 278.0 01/20/2023   Lab Results  Component Value Date   NA 141 01/20/2023   K 4.3 01/20/2023   CO2 29 01/20/2023   GLUCOSE 103 (H) 01/20/2023   BUN 19 01/20/2023   CREATININE 0.62 01/20/2023   BILITOT 0.7 01/20/2023   ALKPHOS 86 01/20/2023   AST 15 01/20/2023   ALT 14 01/20/2023   PROT 6.5 01/20/2023   ALBUMIN 4.3 01/20/2023   CALCIUM  9.5 01/20/2023   GFR 97.29 01/20/2023   Lab Results  Component Value Date   CHOL 230 (H) 01/20/2023   Lab Results  Component Value Date   HDL 70.20 01/20/2023   Lab Results  Component Value Date   LDLCALC 136 (H) 01/20/2023   Lab Results  Component Value Date   TRIG 117.0 01/20/2023   Lab Results  Component Value Date   CHOLHDL 3 01/20/2023   No results found for: HGBA1C     Assessment & Plan:  Preventative health  care Assessment & Plan: Ghm utd Check  labs  See AVS Health Maintenance  Topic Date Due   Pneumococcal Vaccine: 50+ Years (1 of 1 - PCV) Never done   Influenza Vaccine  11/11/2023   COVID-19 Vaccine (4 - 2025-26 season) 12/12/2023   Colonoscopy  03/01/2024   Mammogram  03/20/2024   Cervical Cancer Screening (HPV/Pap Cotest)  01/19/2028   DTaP/Tdap/Td (4 - Td or Tdap) 01/06/2031   Hepatitis C Screening  Completed   HIV Screening  Completed   Zoster Vaccines- Shingrix   Completed   Hepatitis B Vaccines 19-59 Average Risk  Aged Out   HPV VACCINES  Aged Out   Meningococcal B Vaccine  Aged Out     Orders: -     CBC with Differential/Platelet -     Comprehensive metabolic panel with GFR -     Lipid panel -     TSH  Need for influenza vaccination -     Flu vaccine trivalent PF, 6mos and older(Flulaval,Afluria,Fluarix,Fluzone)  Need for pneumococcal 20-valent conjugate vaccination -     Pneumococcal conjugate vaccine 20-valent  Suspicious nevus -     Ambulatory referral to Dermatology  Estrogen deficiency -     DG Bone Density; Future  Hypothyroidism, unspecified type Assessment & Plan: Per endo   Assessment and Plan Assessment & Plan Adult Wellness Visit   During her routine adult wellness visit, she reported overall good health with no changes in family history or new surgeries. She struggles with weight loss despite efforts and thyroid  management. Continue her current exercise regimen.  Thyroid  disorder   Her thyroid  disorder is managed by an endocrinologist, with recent tests showing normal thyroid  function. She reports difficulty with weight loss, possibly related to thyroid  issues. Continue management with her endocrinologist.  Seborrheic keratosis (suspected)   A lesion suspected to be seborrheic keratosis was discussed. While typically benign, these lesions can be precancerous. Refer to a dermatologist for evaluation.  Immunizations   She is due for flu and pneumonia vaccines. The benefits and minimal  side effects of the pneumonia vaccine, recommended for those aged 70 and above, were discussed. She agreed to receive the pneumonia vaccine. Administer both flu and pneumonia vaccines.  General Health Maintenance   She is due for a colonoscopy and mammogram. Scheduling options for a bone density test were discussed. Schedule a colonoscopy with [facility], a mammogram at the breast center, and a bone density test either at [facility] or downstairs radiology.    Daily Crate R Lowne Chase, DO

## 2024-01-23 NOTE — Assessment & Plan Note (Signed)
 Per endo

## 2024-02-01 ENCOUNTER — Other Ambulatory Visit: Payer: Self-pay | Admitting: Family Medicine

## 2024-02-01 ENCOUNTER — Ambulatory Visit: Payer: Self-pay | Admitting: Family Medicine

## 2024-02-01 DIAGNOSIS — Z1231 Encounter for screening mammogram for malignant neoplasm of breast: Secondary | ICD-10-CM

## 2024-02-21 ENCOUNTER — Telehealth: Payer: Self-pay | Admitting: Family Medicine

## 2024-02-21 NOTE — Telephone Encounter (Signed)
 Copied from CRM [id number]. Topic: General - Other >> Feb 21, 2024  9:48 AM Tinnie BROCKS wrote: Reason for CRM: [place] with [employer] member services with patient on the other line is calling to get a CPT code for DEXA bone density scan patient has scheduled 12/4. I was able to provide diagnosis code, but CPT procedure code still needed. They are requesting we call the patient back at [phone] to relay the CPT code and she will call insurance back.

## 2024-02-21 NOTE — Telephone Encounter (Signed)
 CPT code- 22919 for bone density. Tried calling Pt- she could not hear me, will try again later.

## 2024-03-15 ENCOUNTER — Other Ambulatory Visit (HOSPITAL_BASED_OUTPATIENT_CLINIC_OR_DEPARTMENT_OTHER)

## 2024-03-21 ENCOUNTER — Ambulatory Visit
Admission: RE | Admit: 2024-03-21 | Discharge: 2024-03-21 | Disposition: A | Discharge status: Home / Self Care | Source: Ambulatory Visit | Attending: Family Medicine | Admitting: Family Medicine

## 2024-03-21 DIAGNOSIS — Z1231 Encounter for screening mammogram for malignant neoplasm of breast: Secondary | ICD-10-CM

## 2024-12-07 ENCOUNTER — Ambulatory Visit: Admitting: Internal Medicine

## 2025-01-24 ENCOUNTER — Encounter: Admitting: Family Medicine
